# Patient Record
Sex: Female | Born: 1960 | Race: White | Hispanic: No | Marital: Married | Smoking: Never smoker
Health system: Southern US, Community
[De-identification: ages and names within clinical notes are randomized; demographics above are authoritative.]

## PROBLEM LIST (undated history)

## (undated) DIAGNOSIS — L719 Rosacea, unspecified: Secondary | ICD-10-CM

## (undated) DIAGNOSIS — I10 Essential (primary) hypertension: Secondary | ICD-10-CM

## (undated) DIAGNOSIS — E039 Hypothyroidism, unspecified: Secondary | ICD-10-CM

## (undated) DIAGNOSIS — G2581 Restless legs syndrome: Secondary | ICD-10-CM

## (undated) HISTORY — DX: Rosacea, unspecified: L71.9

## (undated) HISTORY — PX: VEIN SURGERY: SHX48

## (undated) HISTORY — PX: BUNIONECTOMY: SHX129

## (undated) HISTORY — DX: Hypothyroidism, unspecified: E03.9

## (undated) HISTORY — PX: OTHER SURGICAL HISTORY: SHX169

---

## 2016-06-05 ENCOUNTER — Encounter: Payer: Self-pay | Admitting: Physician Assistant

## 2016-06-05 ENCOUNTER — Ambulatory Visit (INDEPENDENT_AMBULATORY_CARE_PROVIDER_SITE_OTHER): Payer: BC Managed Care – PPO | Admitting: Physician Assistant

## 2016-06-05 ENCOUNTER — Encounter (INDEPENDENT_AMBULATORY_CARE_PROVIDER_SITE_OTHER): Payer: Self-pay

## 2016-06-05 VITALS — BP 130/90 | HR 77 | Ht 64.0 in | Wt 148.8 lb

## 2016-06-05 DIAGNOSIS — E785 Hyperlipidemia, unspecified: Secondary | ICD-10-CM | POA: Diagnosis not present

## 2016-06-05 DIAGNOSIS — E039 Hypothyroidism, unspecified: Secondary | ICD-10-CM | POA: Diagnosis not present

## 2016-06-05 DIAGNOSIS — R079 Chest pain, unspecified: Secondary | ICD-10-CM | POA: Diagnosis not present

## 2016-06-05 DIAGNOSIS — M5020 Other cervical disc displacement, unspecified cervical region: Secondary | ICD-10-CM | POA: Insufficient documentation

## 2016-06-05 NOTE — Patient Instructions (Addendum)
Medication Instructions:  None Ordered   Labwork: None Ordered   Testing/Procedures: Your physician has requested that you have an exercise tolerance test. For further information please visit [email or web address]. Please also follow instruction sheet, as given.     Follow-Up: Your physician recommends that you schedule a follow-up appointment in: As needed following test.    Any Other Special Instructions Will Be Listed Below (If Applicable).     If you need a refill on your cardiac medications before your next appointment, please call your pharmacy.

## 2016-06-05 NOTE — Progress Notes (Addendum)
Cardiology Office Note    Date:  06/05/2016   ID:  Jessica Haney, DOB [birth year], MRN [id number]  PCP:  Jonathon Bellows, MD  Cardiologist: New  Chief Complaint  Patient presents with  . Chest Pain    History of Present Illness:  Jessica Haney is a 56 y.o. female referred to [place] from Dr. Lujean Amel for chest pain.She has no family history of early CAD, HTN, DM,and never smoked. Had nonfasting lipids checked 05/22/16 Chol 246, HDL 6, trig 131, LDL 143. EKG reviewed from 05/22/16 is normal. She has history of hypothyroidism and rosacea.  She complains of several month history of chest pain described as a tightness that feels like she has to take a deep breath and heart heart is pounding but not going fast or irregular. 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 She walks1 mile a day and also goes to the gym several times a week. She denies any chest pain or tightness while she exercises. She does feel like she gets short of breath a little more quickly than usual but has gained some weight secondary to prednisone for ruptured disc in her neck. She has no radiation of the pain, palpitations, edema, dizziness, or presyncope.   Past Medical History:  Diagnosis Date  . Hypothyroidism   . Rosacea     Past Surgical History:  Procedure Laterality Date  . VEIN SURGERY Right     Current Medications: No outpatient prescriptions prior to visit.   No facility-administered medications prior to visit.      Allergies:   Patient has no known allergies.   Social History   Social History  . Marital status: Married    Spouse name: N/A  . Number of children: N/A  . Years of education: N/A   Social History Main Topics  . Smoking status: Never Smoker  . Smokeless tobacco: Never Used  . Alcohol use None  . Drug use: Unknown  . Sexual activity: Not Asked   Other Topics Concern  . None   Social History Narrative  . None     Family History:  The patient's    family history includes Emphysema in her father; Hyperlipidemia in her father and mother; Hypertension in her father and mother.   ROS:   Please see the history of present illness.    Review of Systems  Constitution: Negative.  HENT: Negative.   Eyes: Negative.   Cardiovascular: Positive for chest pain and dyspnea on exertion.  Respiratory: Negative.   Hematologic/Lymphatic: Negative.   Musculoskeletal: Positive for back pain and neck pain. Negative for joint pain.  Gastrointestinal: Negative.   Genitourinary: Negative.   Neurological: Negative.    All other systems reviewed and are negative.   PHYSICAL EXAM:   VS:  BP 130/90 (BP Location: Right Arm)   Pulse 77   Ht 5\' 4"  (1.626 m)   Wt 148 lb 12.8 oz (67.5 kg)   BMI 25.54 kg/m   Physical Exam  GEN: Well nourished, well developed, in no acute distress  HEENT: normal  Neck: no JVD, carotid bruits, or masses Cardiac:RRR; no murmurs, rubs, or gallops  Respiratory:  clear to auscultation bilaterally, normal work of breathing GI: soft, nontender, nondistended, + BS Ext: without cyanosis, clubbing, or edema, Good distal pulses bilaterally MS: no deformity or atrophy  Skin: warm and dry, no rash Neuro:  Alert and Oriented x 3  Psych: euthymic mood, full affect  Wt Readings  from Last 3 Encounters:  06/05/16 148 lb 12.8 oz (67.5 kg)      Studies/Labs Reviewed:   EKG:  EKG is   ordered today.  The ekg ordered today demonstrates normal sinus rhythm, normal EKG  Recent Labs: No results found for requested labs within last 8760 hours.   Lipid Panel No results found for: CHOL, TRIG, HDL, CHOLHDL, VLDL, LDLCALC, LDLDIRECT  Additional studies/ records that were reviewed today include:  Lipids reviewed from the patient's phone    ASSESSMENT:    1. Chest pain, unspecified type   2. Hyperlipidemia, unspecified hyperlipidemia type   3. Hypothyroidism, unspecified type      PLAN:  In order of problems listed  above:  Chest pain somewhat atypical and suspected stress related. Discussed with Dr. Burt Knack. We recommend GXT. If normal follow-up when necessary  Hyperlipidemia patient's cholesterol and LDL were quite high but they were nonfasting. Recommend diet and repeat nonfasting lipids in 6 months. Can follow-up with primary care for this.  Hypothyroidism treated by primary care.    Medication Adjustments/Labs and Tests Ordered: Current medicines are reviewed at length with the patient today.  Concerns regarding medicines are outlined above.  Medication changes, Labs and Tests ordered today are listed in the Patient Instructions below. Patient Instructions  Medication Instructions:  None Ordered   Labwork: None Ordered   Testing/Procedures: Your physician has requested that you have an exercise tolerance test. For further information please visit [email or web address]. Please also follow instruction sheet, as given.     Follow-Up: Your physician recommends that you schedule a follow-up appointment in: As needed following test.    Any Other Special Instructions Will Be Listed Below (If Applicable).     If you need a refill on your cardiac medications before your next appointment, please call your pharmacy.      Sumner Boast, PA-C  06/05/2016 9:14 AM    [facility] [place], [place],   [address] Phone: [phone]; Fax: [phone]

## 2016-07-06 ENCOUNTER — Encounter (INDEPENDENT_AMBULATORY_CARE_PROVIDER_SITE_OTHER): Payer: Self-pay

## 2016-07-06 ENCOUNTER — Ambulatory Visit (INDEPENDENT_AMBULATORY_CARE_PROVIDER_SITE_OTHER): Payer: BC Managed Care – PPO

## 2016-07-06 DIAGNOSIS — R079 Chest pain, unspecified: Secondary | ICD-10-CM | POA: Diagnosis not present

## 2016-07-06 LAB — EXERCISE TOLERANCE TEST
CHL RATE OF PERCEIVED EXERTION: 16
CSEPED: 9 min
CSEPEDS: 0 s
Estimated workload: 10.1 METS
MPHR: 164 {beats}/min
Peak HR: 148 {beats}/min
Percent HR: 90 %
Rest HR: 67 {beats}/min

## 2016-07-11 ENCOUNTER — Telehealth: Payer: Self-pay | Admitting: Physician Assistant

## 2016-07-11 NOTE — Telephone Encounter (Signed)
New Message     Pt returning your call about exercise test

## 2016-07-11 NOTE — Telephone Encounter (Signed)
-----   Message from Imogene Burn, PA-C sent at 07/10/2016  7:30 AM EDT ----- Normal stress test. F/u with cardiology prn

## 2016-07-11 NOTE — Telephone Encounter (Signed)
Pt returned my call and she has been made aware of her stress test results.

## 2016-07-28 ENCOUNTER — Ambulatory Visit
Admission: RE | Admit: 2016-07-28 | Discharge: 2016-07-28 | Disposition: A | Discharge status: Home / Self Care | Payer: BC Managed Care – PPO | Source: Ambulatory Visit | Attending: Family Medicine | Admitting: Family Medicine

## 2016-07-28 ENCOUNTER — Other Ambulatory Visit: Payer: Self-pay | Admitting: Family Medicine

## 2016-07-28 DIAGNOSIS — M545 Low back pain: Secondary | ICD-10-CM

## 2018-02-05 DIAGNOSIS — N951 Menopausal and female climacteric states: Secondary | ICD-10-CM | POA: Insufficient documentation

## 2018-08-20 ENCOUNTER — Other Ambulatory Visit: Payer: Self-pay | Admitting: Family Medicine

## 2018-08-20 DIAGNOSIS — E2839 Other primary ovarian failure: Secondary | ICD-10-CM

## 2018-08-20 DIAGNOSIS — N6320 Unspecified lump in the left breast, unspecified quadrant: Secondary | ICD-10-CM

## 2018-08-28 ENCOUNTER — Ambulatory Visit
Admission: RE | Admit: 2018-08-28 | Discharge: 2018-08-28 | Disposition: A | Discharge status: Home / Self Care | Payer: BC Managed Care – PPO | Source: Ambulatory Visit | Attending: Family Medicine | Admitting: Family Medicine

## 2018-08-28 ENCOUNTER — Other Ambulatory Visit: Payer: Self-pay

## 2018-08-28 DIAGNOSIS — N6320 Unspecified lump in the left breast, unspecified quadrant: Secondary | ICD-10-CM

## 2018-09-20 ENCOUNTER — Other Ambulatory Visit: Payer: Self-pay | Admitting: Family Medicine

## 2018-09-20 ENCOUNTER — Ambulatory Visit
Admission: RE | Admit: 2018-09-20 | Discharge: 2018-09-20 | Disposition: A | Discharge status: Home / Self Care | Payer: BC Managed Care – PPO | Source: Ambulatory Visit | Attending: Family Medicine | Admitting: Family Medicine

## 2018-09-20 ENCOUNTER — Other Ambulatory Visit: Payer: BC Managed Care – PPO

## 2018-09-20 DIAGNOSIS — R1032 Left lower quadrant pain: Secondary | ICD-10-CM

## 2018-09-20 MED ORDER — IOPAMIDOL (ISOVUE-300) INJECTION 61%
100.0000 mL | Freq: Once | INTRAVENOUS | Status: AC | PRN
  Administered 2018-09-20: 100 mL via INTRAVENOUS

## 2018-10-16 ENCOUNTER — Ambulatory Visit
Admission: RE | Admit: 2018-10-16 | Discharge: 2018-10-16 | Disposition: A | Discharge status: Home / Self Care | Payer: BC Managed Care – PPO | Source: Ambulatory Visit | Attending: Family Medicine | Admitting: Family Medicine

## 2018-10-16 ENCOUNTER — Other Ambulatory Visit: Payer: Self-pay

## 2018-10-16 DIAGNOSIS — E2839 Other primary ovarian failure: Secondary | ICD-10-CM

## 2018-10-30 ENCOUNTER — Other Ambulatory Visit: Payer: BC Managed Care – PPO

## 2019-01-29 ENCOUNTER — Emergency Department (HOSPITAL_COMMUNITY): Payer: BC Managed Care – PPO

## 2019-01-29 ENCOUNTER — Encounter (HOSPITAL_COMMUNITY): Payer: Self-pay

## 2019-01-29 ENCOUNTER — Other Ambulatory Visit: Payer: Self-pay

## 2019-01-29 ENCOUNTER — Emergency Department (HOSPITAL_COMMUNITY)
Admission: EM | Admit: 2019-01-29 | Discharge: 2019-01-30 | Disposition: A | Discharge status: Home / Self Care | Payer: BC Managed Care – PPO | Attending: Emergency Medicine | Admitting: Emergency Medicine

## 2019-01-29 DIAGNOSIS — E039 Hypothyroidism, unspecified: Secondary | ICD-10-CM | POA: Diagnosis not present

## 2019-01-29 DIAGNOSIS — R0789 Other chest pain: Secondary | ICD-10-CM | POA: Diagnosis present

## 2019-01-29 DIAGNOSIS — R079 Chest pain, unspecified: Secondary | ICD-10-CM | POA: Diagnosis not present

## 2019-01-29 LAB — CBC
HCT: 42.6 % (ref 36.0–46.0)
Hemoglobin: 13.6 g/dL (ref 12.0–15.0)
MCH: 30.6 pg (ref 26.0–34.0)
MCHC: 31.9 g/dL (ref 30.0–36.0)
MCV: 95.9 fL (ref 80.0–100.0)
Platelets: 226 10*3/uL (ref 150–400)
RBC: 4.44 MIL/uL (ref 3.87–5.11)
RDW: 12.3 % (ref 11.5–15.5)
WBC: 7.2 10*3/uL (ref 4.0–10.5)
nRBC: 0 % (ref 0.0–0.2)

## 2019-01-29 LAB — BASIC METABOLIC PANEL
Anion gap: 10 (ref 5–15)
BUN: 13 mg/dL (ref 6–20)
CO2: 26 mmol/L (ref 22–32)
Calcium: 9.3 mg/dL (ref 8.9–10.3)
Chloride: 106 mmol/L (ref 98–111)
Creatinine, Ser: 0.74 mg/dL (ref 0.44–1.00)
GFR calc Af Amer: >60 mL/min (ref >60)
GFR calc non Af Amer: >60 mL/min (ref >60)
Glucose, Bld: 80 mg/dL (ref 70–99)
Potassium: 3.8 mmol/L (ref 3.5–5.1)
Sodium: 142 mmol/L (ref 135–145)

## 2019-01-29 LAB — TROPONIN I (HIGH SENSITIVITY)
Troponin I (High Sensitivity): <2 ng/L (ref <18)
Troponin I (High Sensitivity): <2 ng/L (ref <18)

## 2019-01-29 NOTE — ED Triage Notes (Signed)
Pt c.o chest pressure for the past couple of days, no other symptoms. Pt a.o, resp e.u

## 2019-01-30 DIAGNOSIS — R079 Chest pain, unspecified: Secondary | ICD-10-CM | POA: Diagnosis not present

## 2019-01-30 LAB — D-DIMER, QUANTITATIVE: D-Dimer, Quant: <0.27 ug/mL-FEU (ref 0.00–0.50)

## 2019-01-30 MED ORDER — IBUPROFEN 800 MG PO TABS: 800.0000 mg | ORAL_TABLET | Freq: Three times a day (TID) | ORAL | 0 refills | Status: DC

## 2019-01-30 NOTE — ED Provider Notes (Signed)
[facility] EMERGENCY DEPARTMENT Provider Note   CSN: [id number] Arrival date & time: 01/29/19  1605     History Chief Complaint  Patient presents with  . Chest Pain    Jessica Haney is a 58 y.o. female.  Patient presents to the emergency department with a chief complaint of central chest pain.  She states that she has had the pain for the past 3 to 4 days.  She describes it as a pressure.  Nothing makes the symptoms better or worse.  It is not reproducible.  She denies any associated shortness of breath, but states that sometimes when the pain is present it is hard to take a deep breath.  She denies any fever, chills, cough.  She states that she has recently traveled to [place] by car.  She has history of varicose veins.  She denies any history of PE or DVT.  She denies any family history of early cardiac disease.  She does not smoke.  She has not taken anything for her symptoms.  She does not recall any injuries.  The history is provided by the patient. No language interpreter was used.       Past Medical History:  Diagnosis Date  . Hypothyroidism   . Rosacea     Patient Active Problem List   Diagnosis Date Noted  . Chest pain 06/05/2016  . Hyperlipidemia 06/05/2016  . Hypothyroidism 06/05/2016  . Ruptured disc, cervical 06/05/2016    Past Surgical History:  Procedure Laterality Date  . VEIN SURGERY Right      OB History   No obstetric history on file.     Family History  Problem Relation Age of Onset  . Hyperlipidemia Mother   . Hypertension Mother   . Hypertension Father   . Hyperlipidemia Father   . Emphysema Father   . Breast cancer Sister 22    Social History   Tobacco Use  . Smoking status: Never Smoker  . Smokeless tobacco: Never Used  Substance Use Topics  . Alcohol use: Not on file  . Drug use: Not on file    Home Medications Prior to Admission medications   Medication Sig Start Date End Date Taking? Authorizing  Provider  IBUPROFEN PO Take as directed as needed for back pain.    [provider]  levothyroxine (SYNTHROID, LEVOTHROID) 25 MCG tablet Take 25 mcg by mouth daily before breakfast.    [provider]    Allergies    Patient has no known allergies.  Review of Systems   Review of Systems  All other systems reviewed and are negative.   Physical Exam Updated Vital Signs BP (!) 151/96 (BP Location: Left Arm)   Pulse 65   Temp 97.8 F (36.6 C) (Oral)   Resp 18   Ht 5\' 4"  (1.626 m)   Wt 70.3 kg   SpO2 100%   BMI 26.61 kg/m   Physical Exam Vitals and nursing note reviewed.  Constitutional:      General: She is not in acute distress.    Appearance: She is well-developed.  HENT:     Head: Normocephalic and atraumatic.  Eyes:     Conjunctiva/sclera: Conjunctivae normal.  Cardiovascular:     Rate and Rhythm: Normal rate and regular rhythm.     Heart sounds: No murmur.  Pulmonary:     Effort: Pulmonary effort is normal. No respiratory distress.     Breath sounds: Normal breath sounds.  Abdominal:  Palpations: Abdomen is soft.     Tenderness: There is no abdominal tenderness.  Musculoskeletal:        General: Normal range of motion.     Cervical back: Neck supple.     Right lower leg: No edema.     Left lower leg: No edema.  Skin:    General: Skin is warm and dry.  Neurological:     Mental Status: She is alert and oriented to person, place, and time.  Psychiatric:        Mood and Affect: Mood normal.        Behavior: Behavior normal.     ED Results / Procedures / Treatments   Labs (all labs ordered are listed, but only abnormal results are displayed) Labs Reviewed  BASIC METABOLIC PANEL  CBC  D-DIMER, QUANTITATIVE (NOT AT [facility])  TROPONIN I (HIGH SENSITIVITY)  TROPONIN I (HIGH SENSITIVITY)    EKG EKG Interpretation  Date/Time:  Wednesday January 29 2019 16:20:00 EST Ventricular Rate:  84 PR Interval:  152 QRS Duration: 76 QT  Interval:  356 QTC Calculation: 420 R Axis:   46 Text Interpretation: Normal sinus rhythm Low voltage QRS Borderline ECG No acute changes q waves in v1 and v2 Nonspecific ST abnormality in v1 and v2 No old tracing to compare Confirmed by Varney Biles [phone]) on 01/30/2019 5:13:23 AM   Radiology DG Chest 2 View  Result Date: 01/29/2019 CLINICAL DATA:  Left chest pain EXAM: CHEST - 2 VIEW COMPARISON:  None. FINDINGS: The heart size and mediastinal contours are within normal limits. Both lungs are clear. No pleural effusion or pneumothorax. The visualized skeletal structures are unremarkable. IMPRESSION: No acute process in the chest. Electronically Signed   By: Macy Mis M.D.   On: 01/29/2019 17:06    Procedures Procedures (including critical care time)  Medications Ordered in ED Medications - No data to display  ED Course  I have reviewed the triage vital signs and the nursing notes.  Pertinent labs & imaging results that were available during my care of the patient were reviewed by me and considered in my medical decision making (see chart for details).    MDM Rules/Calculators/A&P                      Patient with central chest pain x3 to 4 days.  There are no reproducible symptoms.  She denies any injuries.  She has had recent long travel.  She also has history of varicose veins.  She does not have any lower extremity swelling or pain.  PE remains on the differential.  Will check D-dimer.  As long as D-dimer is negative, including age-adjusted negative, I believe the patient can be safely discharged.  I doubt ACS.  She has had repeat troponins which are negative.  Her heart score is 3.  Given the length of time that she has had symptoms, would have expected her troponin to be elevated in the case of ACS.  Her chest x-ray is negative.  Her symptoms are not positional, I doubt pericarditis.  Costochondritis is also considered.  Patient signed out to oncoming PA, Albrizze, who  will continue care.  Plan: F/u on d-dimer and repeat EKG  Final Clinical Impression(s) / ED Diagnoses Final diagnoses:  Nonspecific chest pain    Rx / DC Orders ED Discharge Orders    None       Montine Circle, PA-C 01/30/19 [id number]    Varney Biles, MD 02/08/19  2220  

## 2019-01-30 NOTE — ED Notes (Signed)
Patient verbalizes understanding of discharge instructions. Opportunity for questioning and answers were provided. Armband removed by staff, pt discharged from ED. Ambulated out to lobby  

## 2019-01-30 NOTE — ED Provider Notes (Addendum)
Care assumed from R. Browning  PA-C at shift change pending .  See his note for full H&P.   Briefly this is 58 yo female presenting with centralized chest pressure x 4 days. She had recent travel with trip to [place], denies history of PE, DVT, early cardiac disease.  Plan is to follow up on d-dimer and dispo accordingly.   Physical Exam  BP 121/80   Pulse 65   Temp 97.8 F (36.6 C) (Oral)   Resp 13   Ht 5\' 4"  (1.626 m)   Wt 70.3 kg   SpO2 98%   BMI 26.61 kg/m   Physical Exam  PE: Constitutional: well-developed, well-nourished, no apparent distress HENT: normocephalic, atraumatic. no cervical adenopathy Cardiovascular: normal rate and rhythm, distal pulses intact Pulmonary/Chest: effort normal; breath sounds clear and equal bilaterally; no wheezes or rales Abdominal: soft and nontender Musculoskeletal: full ROM, no edema. Homans sign absent bilaterally, no lower extremity edema, no palpable cords, compartments are soft Neurological: alert with goal directed thinking Skin: warm and dry, no rash, no diaphoresis Psychiatric: normal mood and affect, normal behavior    ED Course/Procedures   Results for orders placed or performed during the hospital encounter of 01/29/19 (from the past 24 hour(s))  Troponin I (High Sensitivity)     Status: None   Collection Time: 01/29/19  7:50 PM  Result Value Ref Range   Troponin I (High Sensitivity) <2 <18 ng/L  D-dimer, quantitative (not at [facility])     Status: None   Collection Time: 01/30/19  6:46 AM  Result Value Ref Range   D-Dimer, Quant <0.27 0.00 - 0.50 ug/mL-FEU    EKG Interpretation  Date/Time:  Wednesday January 29 2019 16:20:00 EST Ventricular Rate:  84 PR Interval:  152 QRS Duration: 76 QT Interval:  356 QTC Calculation: 420 R Axis:   46 Text Interpretation: Normal sinus rhythm Low voltage QRS Borderline ECG No acute changes q waves in v1 and v2 Nonspecific ST abnormality in v1 and v2 No old tracing to compare Confirmed by  Varney Biles [phone]) on 01/30/2019 5:13:23 AM       EKG Interpretation  Date/Time:  Thursday January 30 2019 06:39:27 EST Ventricular Rate:  59 PR Interval:  152 QRS Duration: 91 QT Interval:  449 QTC Calculation: 445 R Axis:   10 Text Interpretation: Sinus rhythm Low voltage, precordial leads Anteroseptal infarct, old No STEMI Confirmed by Octaviano Glow [id number]) on 01/30/2019 7:17:35 AM       CHEST - 2 VIEW    COMPARISON: None.    FINDINGS:  The heart size and mediastinal contours are within normal limits.  Both lungs are clear. No pleural effusion or pneumothorax. The  visualized skeletal structures are unremarkable.    IMPRESSION:  No acute process in the chest.      Electronically Signed  By: Macy Mis M.D.  On: 01/29/2019 17:06     MDM   Patient received in signout.  Please see previous provider note. She is 58 year old female presenting with centralized chest pain x4 days.  Work-up for previous provider is overall negative including CBC, BMP, delta troponin. I viewed pt's chest xray and it does not suggest acute infectious processes. Previous provider felt she was low risk for ACS. Heart score of 3. First EKG is poor quality and when repeated does not show stemi.  D-dimer is negative.  Updated patient on results and will discharge with symptomatic care. Recommend close pcp follow up in 2-5 days for recheck, sooner if  symptoms persist. The patient appears reasonably screened and/or stabilized for discharge and I doubt any other medical condition or other [facility] requiring further screening, evaluation, or treatment in the ED at this time prior to discharge. The patient is safe for discharge with strict return precautions discussed.   Portions of this note were generated with Lobbyist. Dictation errors may occur despite best attempts at proofreading.  DX: nonspecific chest pain      Cherre Robins, PA-C 01/30/19 0920     Cherre Robins, PA-C 01/30/19 Warnell Bureau, MD 02/01/19 [phone]

## 2019-04-05 ENCOUNTER — Ambulatory Visit: Payer: BC Managed Care – PPO | Attending: Internal Medicine

## 2019-04-05 DIAGNOSIS — Z23 Encounter for immunization: Secondary | ICD-10-CM | POA: Insufficient documentation

## 2019-04-05 NOTE — Progress Notes (Signed)
   Covid-19 Vaccination Clinic  Name:  Jessica Haney    MRN: [id number] DOB: [birth year]  04/05/2019  Jessica Haney was observed post Covid-19 immunization for 15 minutes without incidence. She was provided with Vaccine Information Sheet and instruction to access the V-Safe system.   Jessica Haney was instructed to call 911 with any severe reactions post vaccine: Marland Kitchen Difficulty breathing  . Swelling of your face and throat  . A fast heartbeat  . A bad rash all over your body  . Dizziness and weakness    Immunizations Administered    Name Date Dose VIS Date Route   Pfizer COVID-19 Vaccine 04/05/2019 10:58 AM 0.3 mL 01/17/2019 Intramuscular   Manufacturer: [place]   Lot: [id number]   [address]: [id number]

## 2019-04-26 ENCOUNTER — Ambulatory Visit: Payer: BC Managed Care – PPO | Attending: Internal Medicine

## 2019-04-26 DIAGNOSIS — Z23 Encounter for immunization: Secondary | ICD-10-CM

## 2019-04-26 NOTE — Progress Notes (Signed)
   Covid-19 Vaccination Clinic  Name:  Jessica Haney    MRN: [id number] DOB: [birth year]  04/26/2019  [name] was observed post Covid-19 immunization for 15 minutes without incident. She was provided with Vaccine Information Sheet and instruction to access the V-Safe system.   [name] was instructed to call 911 with any severe reactions post vaccine: Marland Kitchen Difficulty breathing  . Swelling of face and throat  . A fast heartbeat  . A bad rash all over body  . Dizziness and weakness   Immunizations Administered    Name Date Dose VIS Date Route   Pfizer COVID-19 Vaccine 04/26/2019 11:28 AM 0.3 mL 01/17/2019 Intramuscular   Manufacturer: [place]   Lot: [id number]   [place]: [id number]

## 2019-04-30 ENCOUNTER — Ambulatory Visit: Payer: BC Managed Care – PPO

## 2019-08-13 ENCOUNTER — Other Ambulatory Visit: Payer: Self-pay

## 2019-08-13 ENCOUNTER — Ambulatory Visit (INDEPENDENT_AMBULATORY_CARE_PROVIDER_SITE_OTHER): Payer: BC Managed Care – PPO

## 2019-08-13 ENCOUNTER — Encounter: Payer: Self-pay | Admitting: Podiatry

## 2019-08-13 ENCOUNTER — Ambulatory Visit (INDEPENDENT_AMBULATORY_CARE_PROVIDER_SITE_OTHER): Payer: BC Managed Care – PPO | Admitting: Podiatry

## 2019-08-13 DIAGNOSIS — M79671 Pain in right foot: Secondary | ICD-10-CM | POA: Diagnosis not present

## 2019-08-13 DIAGNOSIS — M84374A Stress fracture, right foot, initial encounter for fracture: Secondary | ICD-10-CM

## 2019-08-13 NOTE — Progress Notes (Signed)
gf

## 2019-08-15 MED ORDER — METHYLPREDNISOLONE 4 MG PO TBPK: ORAL_TABLET | ORAL | 0 refills | Status: DC

## 2019-08-15 MED ORDER — MELOXICAM 15 MG PO TABS: 15.0000 mg | ORAL_TABLET | Freq: Every day | ORAL | 1 refills | Status: DC

## 2019-08-15 NOTE — Progress Notes (Signed)
   HPI: 59 y.o. female presenting today as a new patient for evaluation of pain that is been going on to the right forefoot for the past several months.  Patient does have a history of bilateral foot surgery, bunionectomy procedures.  Right foot surgery: 10 years prior.  Left foot surgery: 20 years prior.  Patient states that over the past few months she has had increased pain and tenderness to the right forefoot.  She is very active she is having pain on the top of the foot near the fifth toe area.  Sudden onset and she denies any history of injury.  Past Medical History:  Diagnosis Date  . Hypothyroidism   . Rosacea      Physical Exam: General: The patient is alert and oriented x3 in no acute distress.  Dermatology: Skin is warm, dry and supple bilateral lower extremities. Negative for open lesions or macerations.  Vascular: Palpable pedal pulses bilaterally. No edema or erythema noted. Capillary refill within normal limits.  Neurological: Epicritic and protective threshold grossly intact bilaterally.   Musculoskeletal Exam: Range of motion within normal limits to all pedal and ankle joints bilateral. Muscle strength 5/5 in all groups bilateral.  There is pain on palpation just proximal to the fourth MTPJ along the fourth metatarsal neck consistent with a possible stress fracture/overuse injury.  Radiographic Exam:  Normal osseous mineralization. Joint spaces preserved. No fracture/dislocation/boney destruction.  There is not appear to be any indication of cortical irregularity or stress fracture along the fourth metatarsal or the surrounding areas  Assessment: 1.  Suspect stress fracture fourth metatarsal right foot based on clinical exam  Plan of Care:  1. Patient evaluated. X-Rays reviewed.  2.  Prescription for Medrol Dosepak 3.  Prescription for meloxicam 15 mg daily 4.  Recommend good supportive shoe gear/sneakers 5.  Recommend reducing the patient's activity and resting the  foot 6.  Return to clinic as needed  *Going to [place], [place] for 3 weeks.  Her daughter is having a baby there      Edrick Kins, DPM [facility]  Dr. Edrick Kins, DPM    [address], [place] [address]                Office [phone]  Fax [phone]

## 2019-11-10 IMAGING — MG DIGITAL DIAGNOSTIC UNILATERAL LEFT MAMMOGRAM WITH TOMO AND CAD
6 series · 6 of 18 positions shown · non-contrast
Comparison: Previous exam(s).

CLINICAL DATA: 58-year-old female presenting for evaluation of a
palpable lump in the left breast. The patient has family history of
breast cancer in her sister diagnosed at age 40.

EXAM:
DIGITAL DIAGNOSTIC LEFT MAMMOGRAM WITH CAD AND TOMO
ULTRASOUND LEFT BREAST

[L TAN synth-2D]
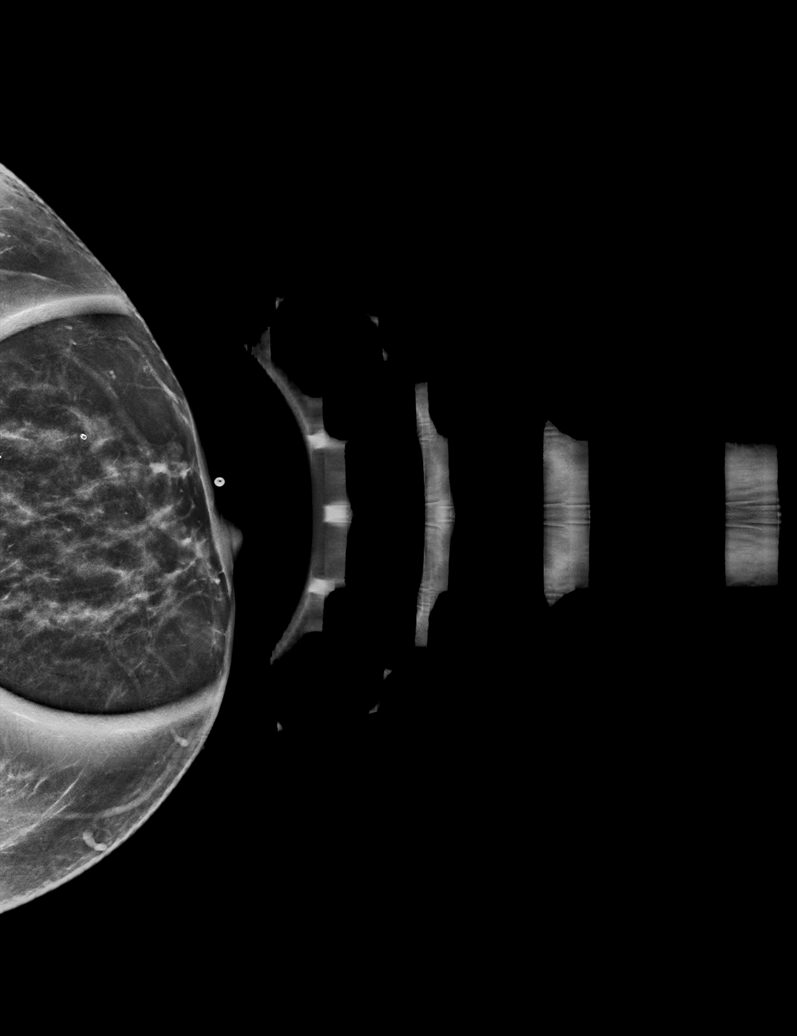

[L CC synth-2D]
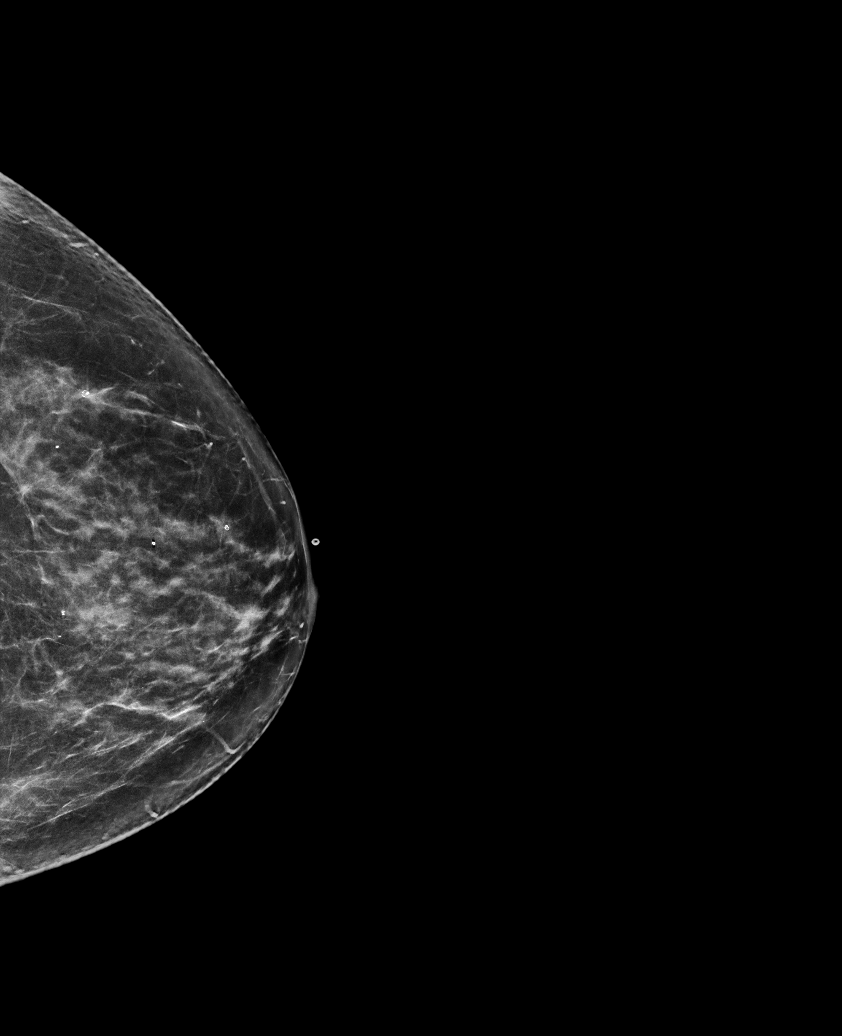

[L MLO synth-2D]
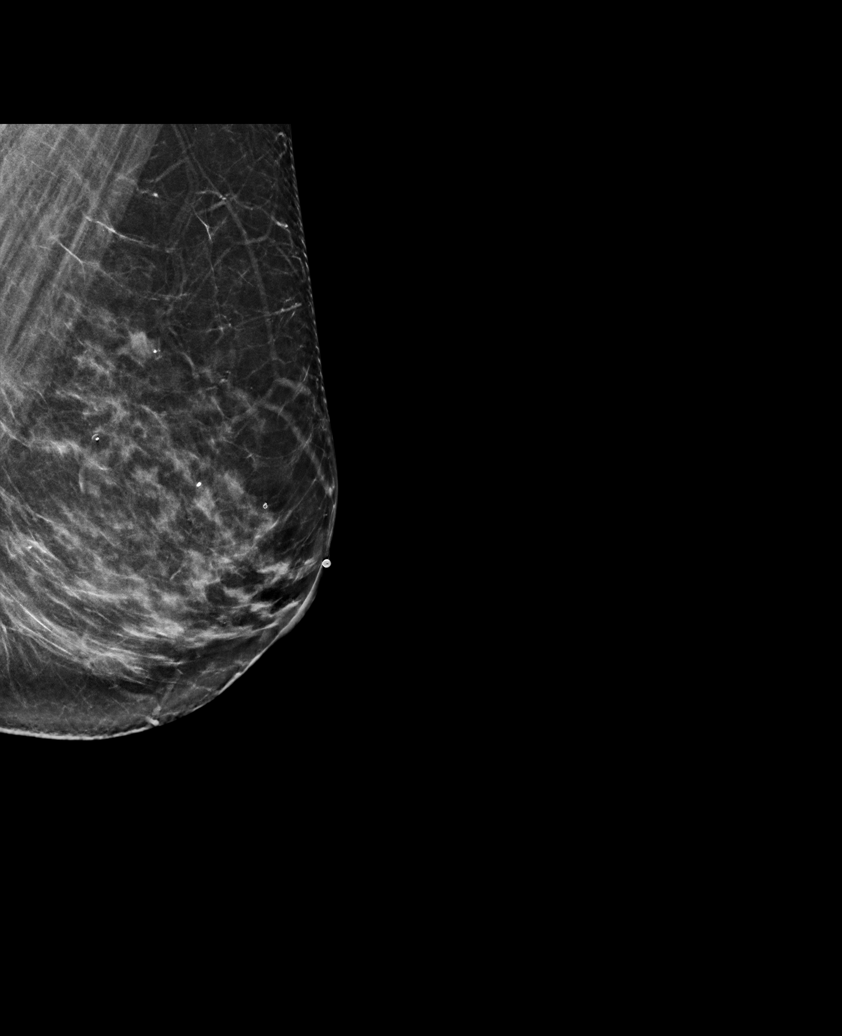

[L MLO tomo · tomo slice 37/72.0]
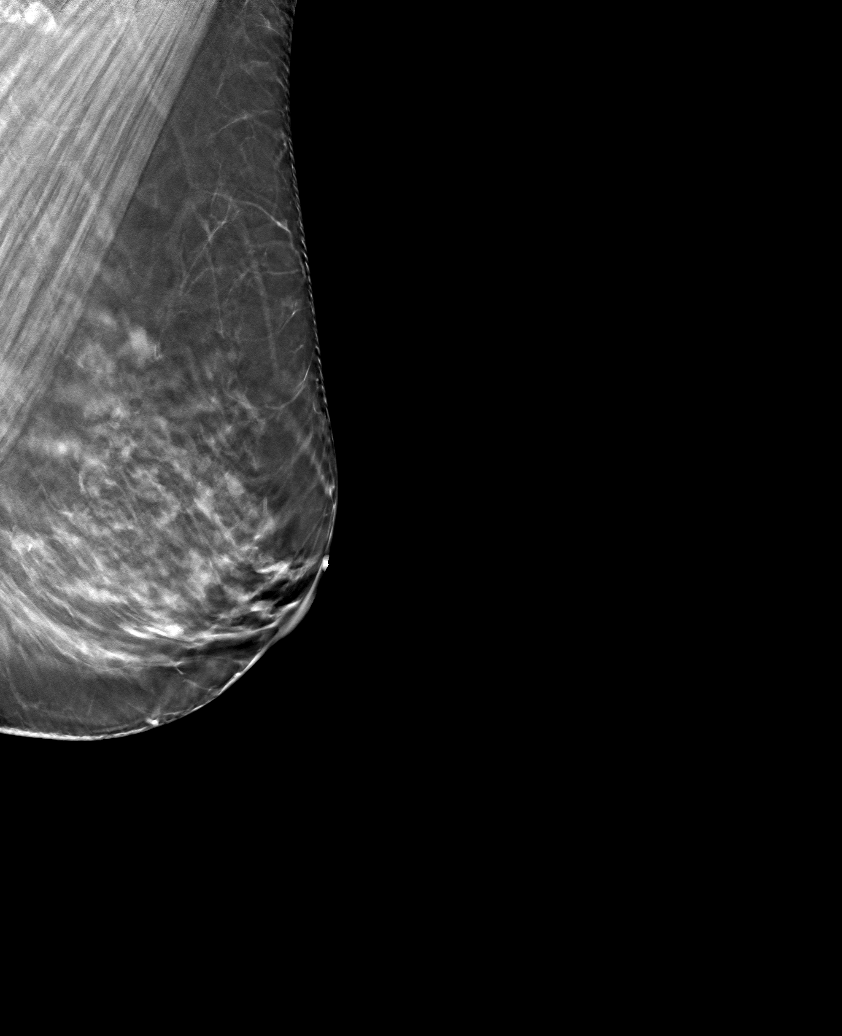

[L CC tomo · tomo slice 36/71.0]
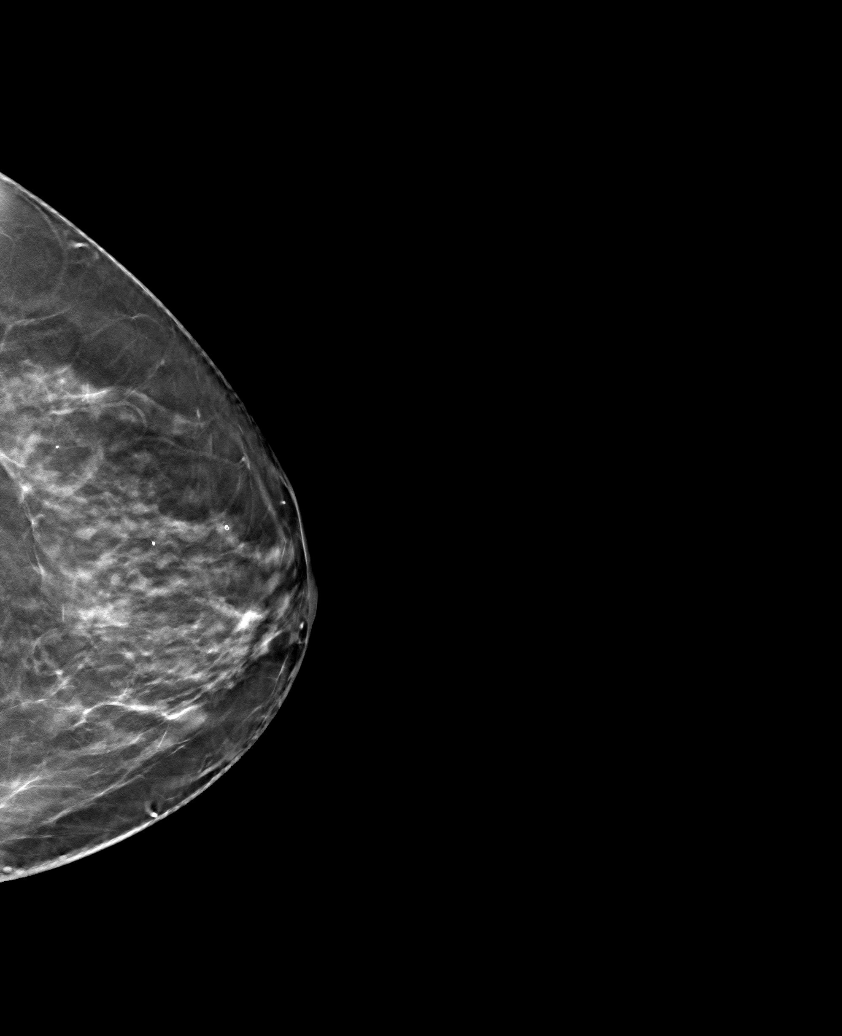

[L TAN tomo · tomo slice 27/52.0]
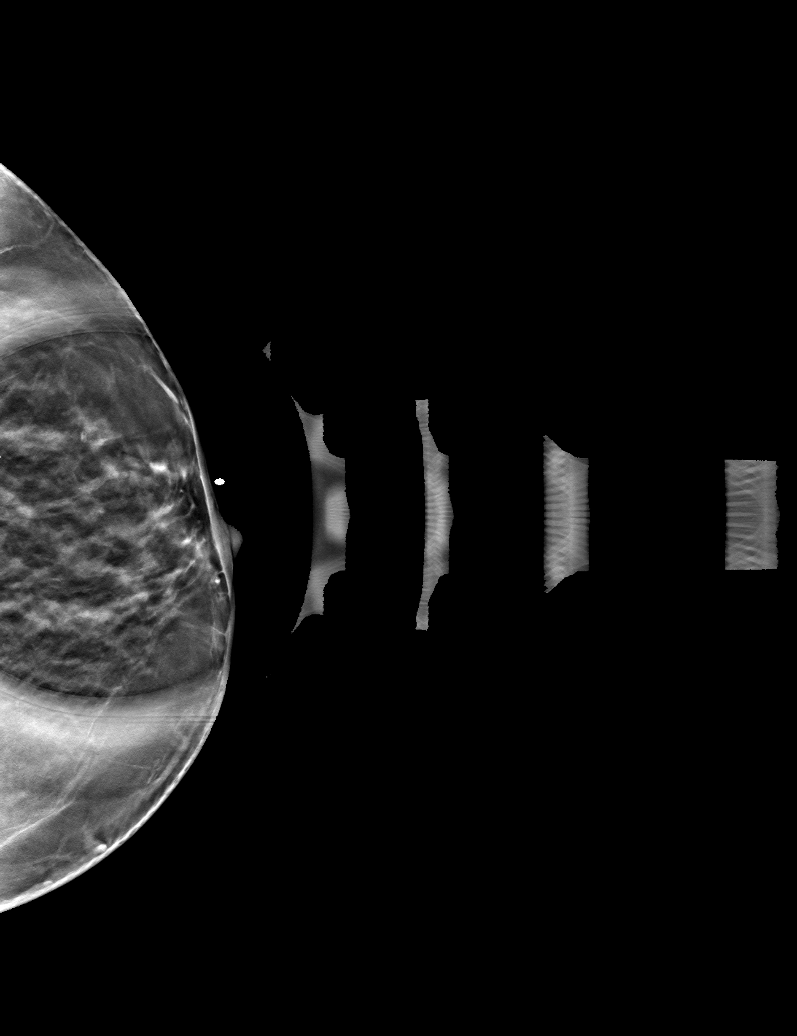

[6 of 18 positions shown; findings below may reference images not displayed]

ACR Breast Density Category c: The breast tissue is heterogeneously
dense, which may obscure small masses.
FINDINGS: A BB has been placed on the palpable site of concern just lateral to
the left nipple. There are no suspicious findings deep to the
palpable marker.

Mammographic images were processed with CAD.

On physical exam, there is a firm ridge of tissue at the palpable
site lateral to the left nipple. No suspicious palpable masses are
identified.

Targeted ultrasound is performed, showing normal fibroglandular
tissue at the palpable site lateral to the left nipple. No
suspicious masses or areas of shadowing are identified.
IMPRESSION: There are no suspicious mammographic or targeted sonographic
abnormalities at the palpable site of concern in the left breast.

RECOMMENDATION:
1. Clinical follow-up recommended for the palpable area of concern
in the left breast. Any further workup should be based on clinical
grounds.

2. Return to routine screening mammography is recommended. The
patient will be due for screening in Monday January, 2019.

I have discussed the findings and recommendations with the patient.
Results were also provided in writing at the conclusion of the
visit. If applicable, a reminder letter will be sent to the patient
regarding the next appointment.

BI-RADS CATEGORY  1: Negative.

## 2019-11-10 IMAGING — US ULTRASOUND LEFT BREAST LIMITED
1 series · 2 of 2 positions shown · non-contrast
Comparison: Previous exam(s).

CLINICAL DATA: 58-year-old female presenting for evaluation of a
palpable lump in the left breast. The patient has family history of
breast cancer in her sister diagnosed at age 40.

EXAM:
DIGITAL DIAGNOSTIC LEFT MAMMOGRAM WITH CAD AND TOMO
ULTRASOUND LEFT BREAST

[Series 1: ultrasound left breast limited · 0.06mm/px · 2 of 2 slices shown]
[im 1/2]
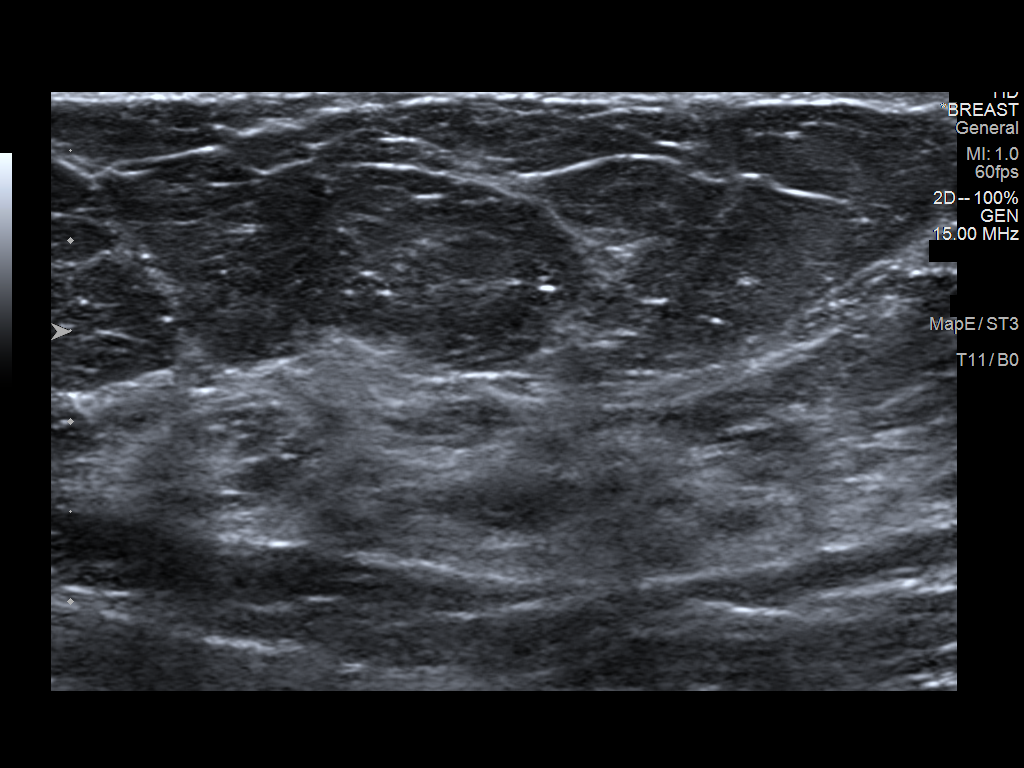
[im 2/2]
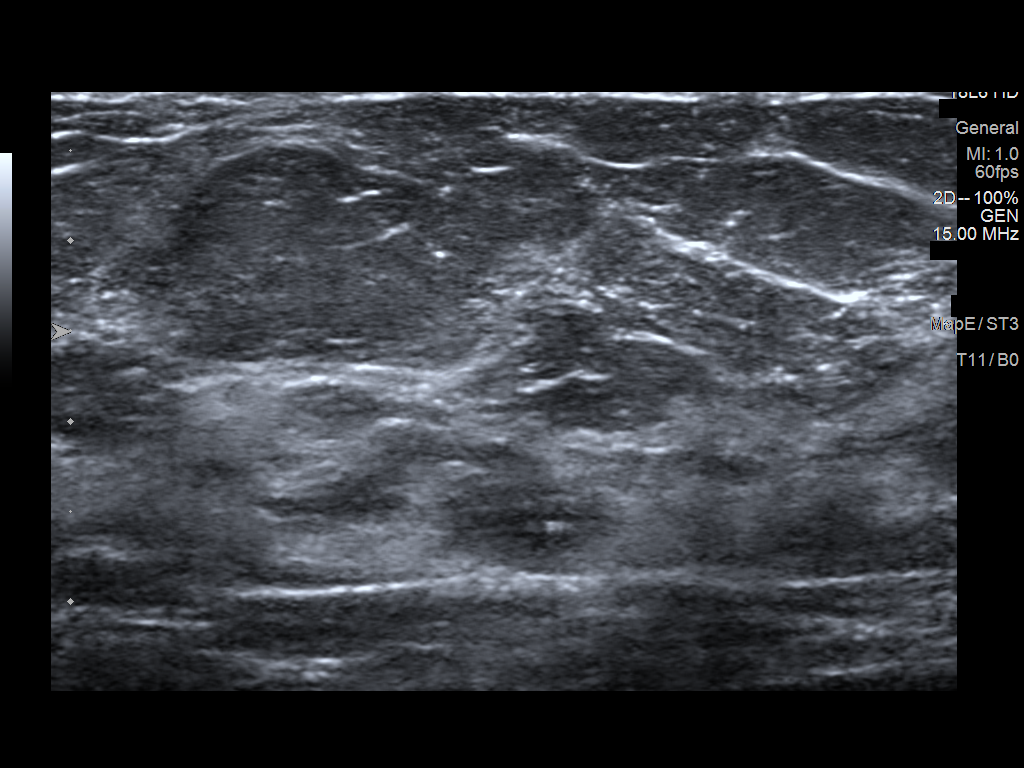

[2 of 2 positions shown; findings below may reference images not displayed]

ACR Breast Density Category c: The breast tissue is heterogeneously
dense, which may obscure small masses.
FINDINGS: A BB has been placed on the palpable site of concern just lateral to
the left nipple. There are no suspicious findings deep to the
palpable marker.

Mammographic images were processed with CAD.

On physical exam, there is a firm ridge of tissue at the palpable
site lateral to the left nipple. No suspicious palpable masses are
identified.

Targeted ultrasound is performed, showing normal fibroglandular
tissue at the palpable site lateral to the left nipple. No
suspicious masses or areas of shadowing are identified.
IMPRESSION: There are no suspicious mammographic or targeted sonographic
abnormalities at the palpable site of concern in the left breast.

RECOMMENDATION:
1. Clinical follow-up recommended for the palpable area of concern
in the left breast. Any further workup should be based on clinical
grounds.

2. Return to routine screening mammography is recommended. The
patient will be due for screening in Monday January, 2019.

I have discussed the findings and recommendations with the patient.
Results were also provided in writing at the conclusion of the
visit. If applicable, a reminder letter will be sent to the patient
regarding the next appointment.

BI-RADS CATEGORY  1: Negative.

## 2019-12-03 IMAGING — CT CT ABDOMEN AND PELVIS WITH CONTRAST
2 of 5 series · 15 of 46 positions shown, 17 images · IV contrast (iopamidol)
Comparison: None.

CLINICAL DATA: Lower abdominal pain, primarily left-sided, with
nausea and vomiting

EXAM:
CT ABDOMEN AND PELVIS WITH CONTRAST
TECHNIQUE: Multidetector CT imaging of the abdomen and pelvis was performed
using the standard protocol following bolus administration of
intravenous contrast.
CONTRAST:  100mL 3FBETH-FKK IOPAMIDOL (3FBETH-FKK) INJECTION 61%

[Series 2: abd pelvis 5.00 br40 s3 axial · axial · 0.73mm/px · z∈[+1090,+1505]mm · 12 of 93 slices shown, 14 images]
[im 5/93  soft-tissue]
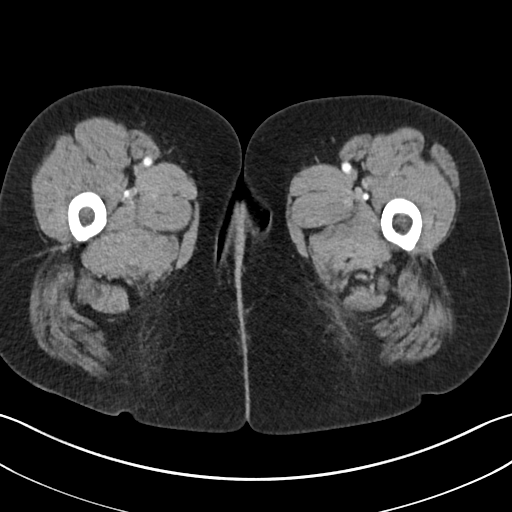
[im 5/93  bone]
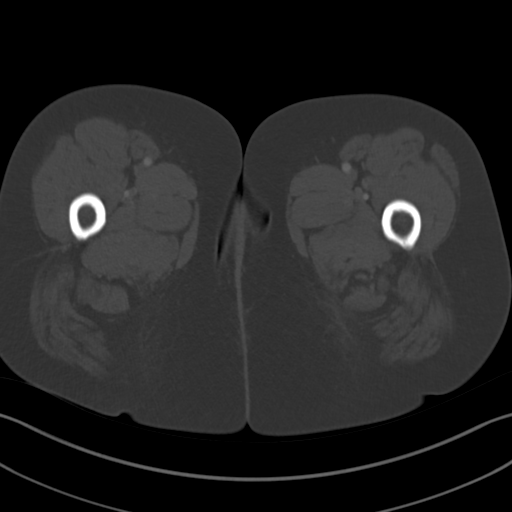
[im 15/93  soft-tissue]
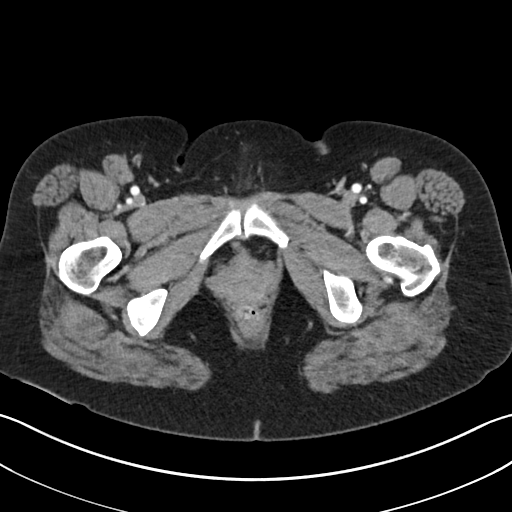
[im 20/93  soft-tissue]
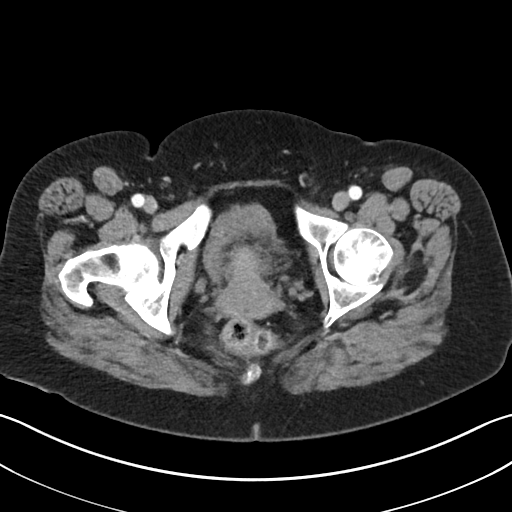
[im 30/93  soft-tissue]
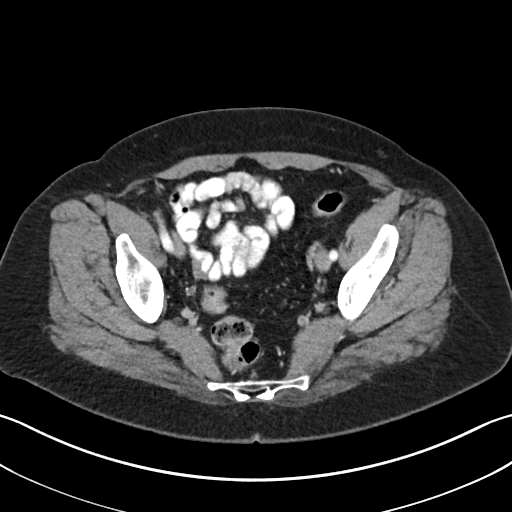
[im 34/93  soft-tissue]
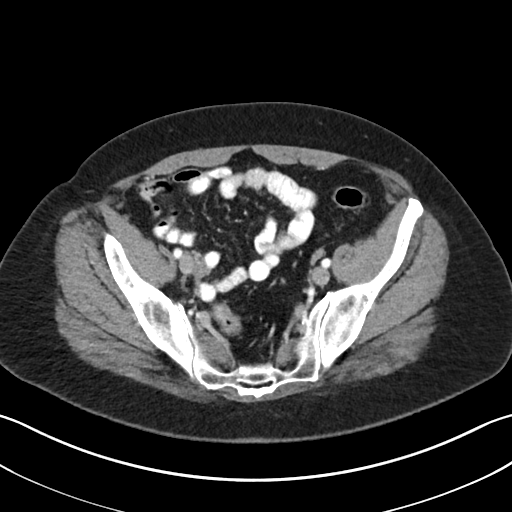
[im 44/93  soft-tissue]
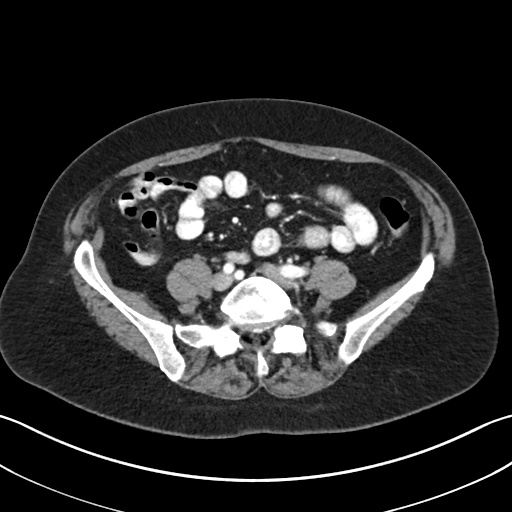
[im 49/93  soft-tissue]
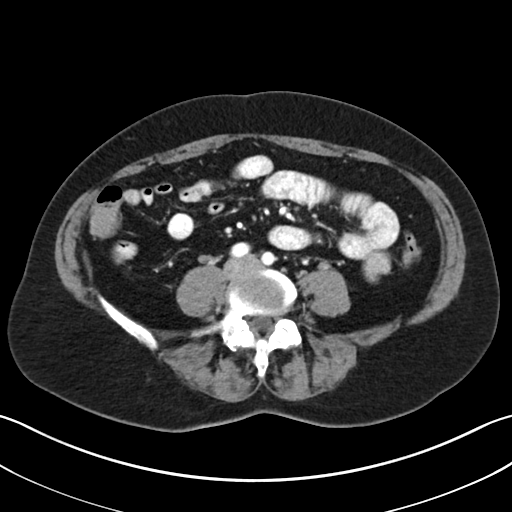
[im 59/93  soft-tissue]
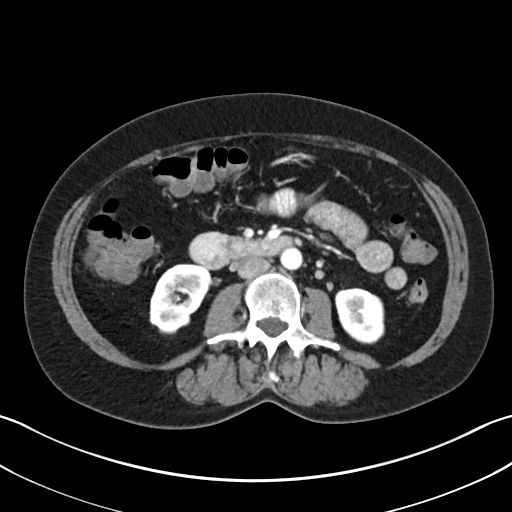
[im 63/93  soft-tissue]
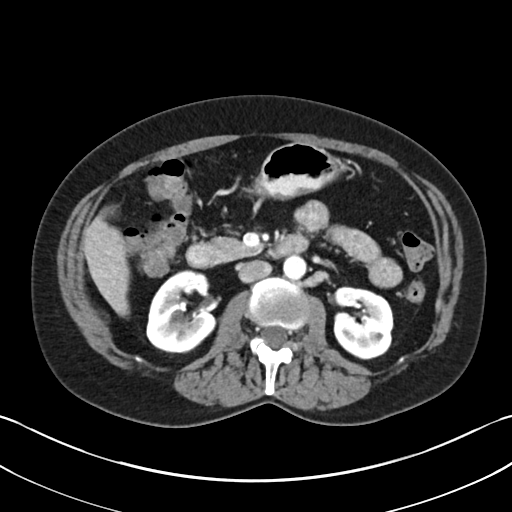
[im 63/93  bone]
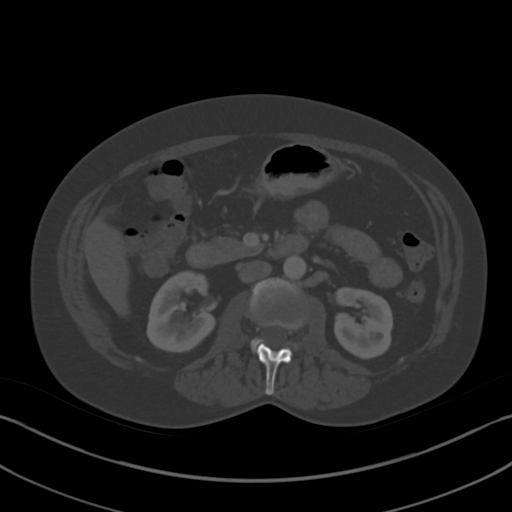
[im 73/93  soft-tissue]
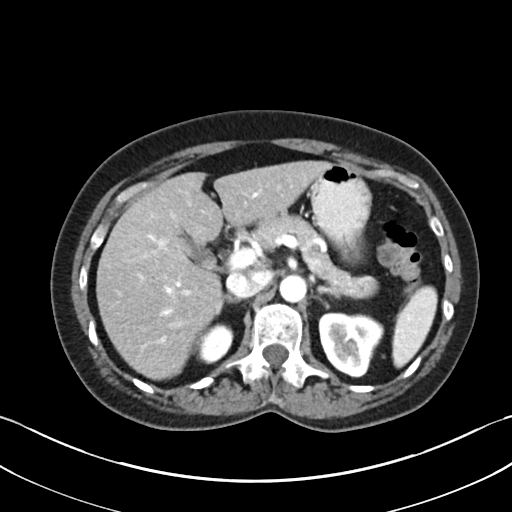
[im 78/93  soft-tissue]
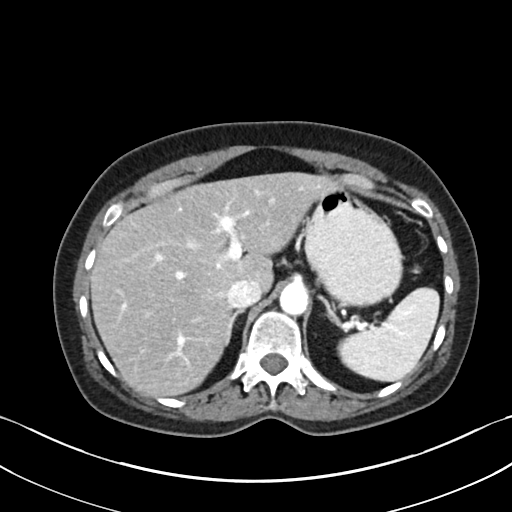
[im 88/93  soft-tissue]
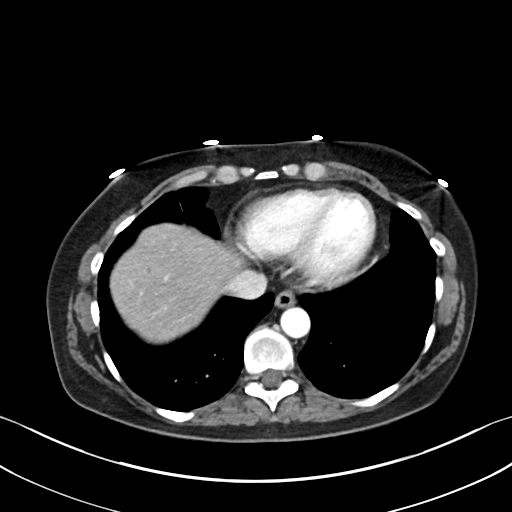

[Series 6: abd pelvis 2.00 br40 s3 cor · coronal · 0.73mm/px · 3 of 137 slices shown]
[im 46/137  soft-tissue]
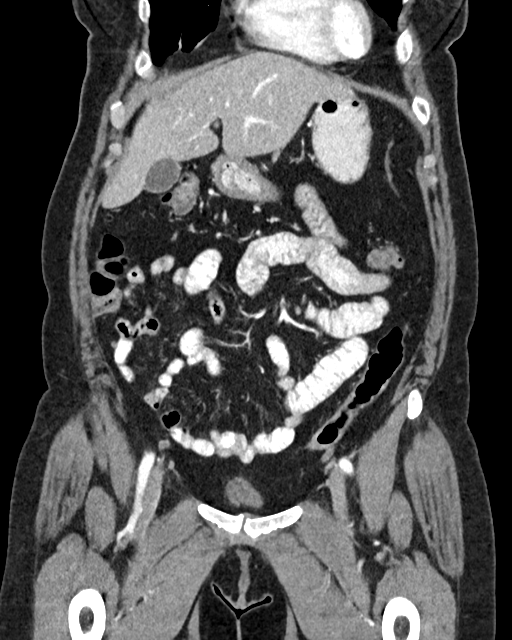
[im 61/137  soft-tissue]
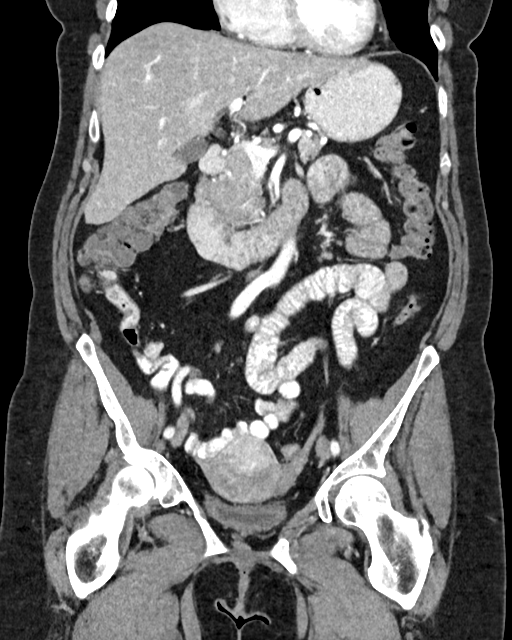
[im 76/137  soft-tissue]
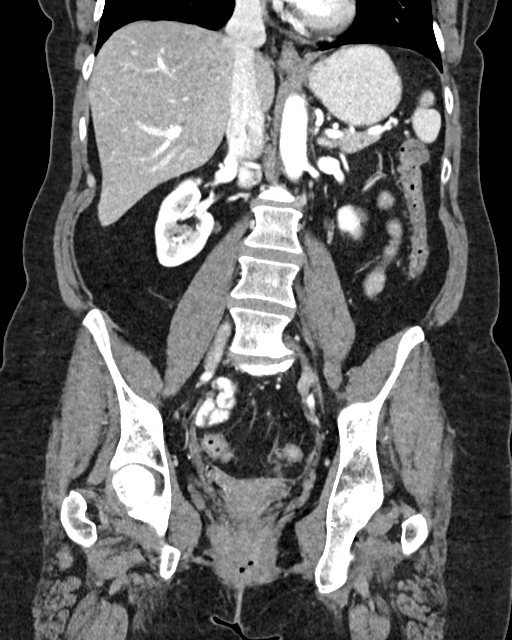

[15 of 46 positions shown; findings below may reference images not displayed]

FINDINGS: Lower chest: There is mild atelectasis in the right middle lobe.
There is no lung base edema or consolidation.

Hepatobiliary: There is a 4 mm probable cyst in the posterior
segment of the right lobe of the liver period no other focal liver
lesions are evident. There is a degree of hepatic steatosis.
Gallbladder wall is not appreciably thickened. There is no biliary
duct dilatation.

Pancreas: No pancreatic mass or inflammatory focus evident.

Spleen: No splenic lesions evident.

Adrenals/Urinary Tract: Adrenals bilaterally appear normal. There is
a 7 x 7 mm probable cyst in the posterior mid right kidney. More
inferiorly, there is an 8 x 8 mm cyst in the right kidney. There is
minimal hydronephrosis on the right. There is no hydronephrosis
appreciable on the left. On the right, there is a calculus in the
lateral midportion of the kidney with an area of nearby mild
scarring measuring 7 x 6 mm. There is a 3 x 3 mm calculus more
inferiorly in the right kidney. On the left, there is a calculus in
the lower pole region measuring 8 x 7 mm. There is a nearby 2 mm
calculus in the lower pole left kidney region. There is a 2 mm
calculus in the mid left kidney region as well as a 1 mm calculus
slightly more inferiorly on the left.

There is a 3 mm calculus in the proximal right ureter at the L4
level. There is mild ureteral edema immediately adjacent to this
calculus in the proximal right ureter. No other ureteral calculi are
evident on either side. Urinary bladder is midline with wall
thickness within normal limits for nearly empty state.

Stomach/Bowel: There are sigmoid diverticula without diverticulitis.
There is no appreciable bowel wall or mesenteric thickening. There
is no demonstrable bowel obstruction. There is no evident free air
or portal venous air. The terminal ileum appears unremarkable.

Vascular/Lymphatic: No abdominal aortic aneurysm. No vascular
lesions are demonstrable beyond minimal calcification in the right
common iliac artery. No adenopathy evident in the abdomen or pelvis.

Reproductive: Uterus is anteverted. There is an apparent leiomyoma
in the anterior superior uterine fundus measuring 3.2 x 3.0 cm. No
extrauterine pelvic mass.

Other: The appendix appears normal. No abscess or ascites is evident
in the abdomen or pelvis. There is thinning of the rectus muscle in
the midline at the umbilicus.

Musculoskeletal: There are no blastic or lytic bone lesions. There
is degenerative change in the lower lumbar region with disc
protrusion at L5-S1. No intramuscular lesions are evident.
IMPRESSION: 1. There is a 3 x 3 mm calculus in the proximal right ureter at the
L4 level. There is slight hydronephrosis on the right. There is mild
edema in the right ureter proximally.

2.  Several nonobstructing intrarenal calculi on each side.

3. Sigmoid diverticula without diverticulitis. No bowel obstruction.
No abscess in the abdomen or pelvis. Appendix appears normal.

4. Apparent leiomyoma in the superior uterine fundus measuring
approximately 3.2 x 3.0 cm.

## 2019-12-18 ENCOUNTER — Encounter: Payer: Self-pay | Admitting: Sports Medicine

## 2019-12-18 ENCOUNTER — Ambulatory Visit (INDEPENDENT_AMBULATORY_CARE_PROVIDER_SITE_OTHER): Payer: BC Managed Care – PPO

## 2019-12-18 ENCOUNTER — Other Ambulatory Visit: Payer: Self-pay

## 2019-12-18 ENCOUNTER — Ambulatory Visit (INDEPENDENT_AMBULATORY_CARE_PROVIDER_SITE_OTHER): Payer: BC Managed Care – PPO | Admitting: Sports Medicine

## 2019-12-18 DIAGNOSIS — M79671 Pain in right foot: Secondary | ICD-10-CM | POA: Diagnosis not present

## 2019-12-18 DIAGNOSIS — M7661 Achilles tendinitis, right leg: Secondary | ICD-10-CM

## 2019-12-18 DIAGNOSIS — M778 Other enthesopathies, not elsewhere classified: Secondary | ICD-10-CM

## 2019-12-18 DIAGNOSIS — M79672 Pain in left foot: Secondary | ICD-10-CM

## 2019-12-18 MED ORDER — METHYLPREDNISOLONE 4 MG PO TBPK: ORAL_TABLET | ORAL | 0 refills | Status: DC

## 2019-12-18 NOTE — Progress Notes (Signed)
Subjective: Jessica Haney is a 59 y.o. female patient who presents to office for evaluation of Right heel pain and left 2nd toe pain. Patient complains of progressive pain especially over the last 3 weeks at the back of the heel that has come out of nowhere. Reports that pain is worse after walking dogs and pain as been so bad and the area has been really sensitive. Reports that left 2nd toe tingles and gets numb sometimes for the last 3 months and notices that when she puts pressure on the ball of her foot sometimes she feels the pain. Patient denies any other pedal complaints.   Patient Active Problem List   Diagnosis Date Noted  . Menopausal symptom 02/05/2018  . Chest pain 06/05/2016  . Hyperlipidemia 06/05/2016  . Hypothyroidism 06/05/2016  . Ruptured disc, cervical 06/05/2016    Current Outpatient Medications on File Prior to Visit  Medication Sig Dispense Refill  . amLODipine (NORVASC) 5 MG tablet Take 5 mg by mouth daily.    . influenza vac split quadrivalent PF (AFLURIA QUADRIVALENT) 0.5 ML injection Afluria Qd 2020-21 (36 mos up)(PF)60 mcg (15 mcg x4)/0.5 mL IM syringe  ADM 0.5ML IM UTD    . Influenza Vac Typ A&B Surf Ant 0.5 ML SUSY Fluvirin 2017-2018 (PF) 45 mcg(15 mcg x3)/0.5 mL intramuscular syringe  ADM 0.5ML IM UTD    . levothyroxine (SYNTHROID) 75 MCG tablet Take 75 mcg by mouth every morning.    Marland Kitchen levothyroxine (SYNTHROID, LEVOTHROID) 25 MCG tablet Take 25 mcg by mouth daily before breakfast.    . meloxicam (MOBIC) 15 MG tablet Take 1 tablet (15 mg total) by mouth daily. 30 tablet 1  . triamcinolone cream (KENALOG) 0.1 % triamcinolone acetonide 0.1 % topical cream  APPLY A SMALL AMOUNT TO THE AFFECTED AREA ON FINGER ONCE DAILY AS NEEDED    . venlafaxine XR (EFFEXOR-XR) 37.5 MG 24 hr capsule Take 37.5 mg by mouth daily.     No current facility-administered medications on file prior to visit.    Allergies  Allergen Reactions  . Acyclovir Nausea And Vomiting  .  Cyclobenzaprine Nausea And Vomiting    Objective:  General: Alert and oriented x3 in no acute distress  Dermatology: No open lesions bilateral lower extremities, no webspace macerations, no ecchymosis bilateral, all nails x 10 are well manicured. Old surgery scars well healed.   Vascular: Dorsalis Pedis and Posterior Tibial pedal pulses 2/4, Capillary Fill Time 3 seconds, + pedal hair growth bilateral, no edema bilateral lower extremities, Temperature gradient within normal limits.  Neurology: Gross sensation intact via light touch bilateral, - Moulders sign on left.  Musculoskeletal: Mild tenderness with palpation at insertion of the Achilles on Right posterior heel at medial aspect, there is no calcaneal exostosis with mild soft tissue swelling present and decreased ankle rom with knee extending  vs flexed resembling gastroc equnius bilateral, The achilles tendon feels intact with no nodularity or palpable dell, Thompson sign negative, Subtalar and midtarsal joint range of motion is within normal limits, there is no 1st ray hypermobility or forefoot deformity noted bilateral except on left there is limited range at 1st MTPJ only about 40 deg on left with likely 2nd MTPJ overload on left. No reproducible pain on left.   Gait: Antalgic gait with increased heel off right  Xrays  Right/Left Foot    Impression: Normal osseous mineralization. Joint spaces preserved except at 5th MTPJs consistent with bunionette. No fracture/dislocation/boney destruction. Calcaneal spur present. Kager's triangle intact with no  obliteration. No soft tissue abnormalities or radiopaque foreign bodies.   Assessment and Plan: Problem List Items Addressed This Visit    None    Visit Diagnoses    Pain in left foot    -  Primary   Relevant Orders   DG Foot Complete Left   Pain in right foot       Relevant Orders   DG Foot Complete Right   Tendonitis, Achilles, right       Capsulitis of foot, left           -Complete examination performed -Xrays reviewed -Discussed treatement options -Rx Medrol dose pack -Dispensed ice pack and night splint to use as instructed on the right -Dispensed met pad to use on left -No improvement will consider MRI/PT/EPAT -Patient to return to office if no better after 1 month or sooner if condition worsens.  Landis Martins, DPM

## 2019-12-23 ENCOUNTER — Other Ambulatory Visit: Payer: Self-pay | Admitting: Sports Medicine

## 2019-12-23 DIAGNOSIS — M778 Other enthesopathies, not elsewhere classified: Secondary | ICD-10-CM

## 2019-12-23 DIAGNOSIS — M7661 Achilles tendinitis, right leg: Secondary | ICD-10-CM

## 2020-01-29 ENCOUNTER — Encounter: Payer: Self-pay | Admitting: Sports Medicine

## 2020-01-29 ENCOUNTER — Other Ambulatory Visit: Payer: Self-pay

## 2020-01-29 ENCOUNTER — Ambulatory Visit (INDEPENDENT_AMBULATORY_CARE_PROVIDER_SITE_OTHER): Payer: BC Managed Care – PPO | Admitting: Sports Medicine

## 2020-01-29 DIAGNOSIS — L719 Rosacea, unspecified: Secondary | ICD-10-CM | POA: Insufficient documentation

## 2020-01-29 DIAGNOSIS — K5792 Diverticulitis of intestine, part unspecified, without perforation or abscess without bleeding: Secondary | ICD-10-CM | POA: Insufficient documentation

## 2020-01-29 DIAGNOSIS — M79671 Pain in right foot: Secondary | ICD-10-CM

## 2020-01-29 DIAGNOSIS — M8580 Other specified disorders of bone density and structure, unspecified site: Secondary | ICD-10-CM | POA: Insufficient documentation

## 2020-01-29 DIAGNOSIS — I10 Essential (primary) hypertension: Secondary | ICD-10-CM | POA: Insufficient documentation

## 2020-01-29 DIAGNOSIS — M1990 Unspecified osteoarthritis, unspecified site: Secondary | ICD-10-CM | POA: Insufficient documentation

## 2020-01-29 DIAGNOSIS — M7661 Achilles tendinitis, right leg: Secondary | ICD-10-CM

## 2020-01-29 DIAGNOSIS — C44310 Basal cell carcinoma of skin of unspecified parts of face: Secondary | ICD-10-CM | POA: Insufficient documentation

## 2020-01-29 MED ORDER — TRIAMCINOLONE ACETONIDE 10 MG/ML IJ SUSP
10.0000 mg | Freq: Once | INTRAMUSCULAR | Status: AC
  Administered 2020-01-29: 09:00:00 10 mg

## 2020-01-29 NOTE — Progress Notes (Signed)
Subjective: Jessica Haney is a 59 y.o. female patient who presents to office for evaluation of Right heel pain. Patient reports pain is the same boot and steriod pak has not helped. Still worse with shoes and reports that she lost heel lifts provided last visit. Patient denies any other pedal complaints.   Patient Active Problem List   Diagnosis Date Noted  . Arthritis 01/29/2020  . Basal cell carcinoma of face 01/29/2020  . Diverticulitis 01/29/2020  . Essential hypertension 01/29/2020  . Osteopenia 01/29/2020  . Rosacea 01/29/2020  . Menopausal symptom 02/05/2018  . Chest pain 06/05/2016  . Hyperlipidemia 06/05/2016  . Hypothyroidism 06/05/2016  . Ruptured disc, cervical 06/05/2016    Current Outpatient Medications on File Prior to Visit  Medication Sig Dispense Refill  . amLODipine (NORVASC) 5 MG tablet Take 5 mg by mouth daily.    Marland Kitchen ibuprofen (ADVIL) 200 MG tablet 1 tablet with food or milk as needed    . influenza vac split quadrivalent PF (AFLURIA QUADRIVALENT) 0.5 ML injection Afluria Qd 2020-21 (36 mos up)(PF)60 mcg (15 mcg x4)/0.5 mL IM syringe  ADM 0.5ML IM UTD    . Influenza Vac Typ A&B Surf Ant 0.5 ML SUSY Fluvirin 2017-2018 (PF) 45 mcg(15 mcg x3)/0.5 mL intramuscular syringe  ADM 0.5ML IM UTD    . levothyroxine (SYNTHROID) 75 MCG tablet Take 75 mcg by mouth every morning.    Marland Kitchen levothyroxine (SYNTHROID, LEVOTHROID) 25 MCG tablet Take 25 mcg by mouth daily before breakfast.    . loratadine (CLARITIN) 10 MG tablet 1 tablet    . meloxicam (MOBIC) 15 MG tablet Take 1 tablet (15 mg total) by mouth daily. 30 tablet 1  . methylPREDNISolone (MEDROL DOSEPAK) 4 MG TBPK tablet 6 day dose pack - take as directed 21 tablet 0  . metroNIDAZOLE (METROGEL) 1 % gel 1 application to affected area    . Multiple Minerals-Vitamins (CITRACAL PLUS) TABS See admin instructions.    . naproxen sodium (ALEVE) 220 MG tablet 1 tablet with food or milk as needed    . triamcinolone cream (KENALOG) 0.1  % triamcinolone acetonide 0.1 % topical cream  APPLY A SMALL AMOUNT TO THE AFFECTED AREA ON FINGER ONCE DAILY AS NEEDED    . venlafaxine XR (EFFEXOR-XR) 37.5 MG 24 hr capsule Take 37.5 mg by mouth daily.     No current facility-administered medications on file prior to visit.    Allergies  Allergen Reactions  . Acyclovir Nausea And Vomiting  . Cyclobenzaprine Nausea And Vomiting    Objective:  General: Alert and oriented x3 in no acute distress  Dermatology: No open lesions bilateral lower extremities, no webspace macerations, no ecchymosis bilateral, all nails x 10 are well manicured.  Vascular: Dorsalis Pedis and Posterior Tibial pedal pulses 2/4, Capillary Fill Time 3 seconds, + pedal hair growth bilateral, no edema bilateral lower extremities, Temperature gradient within normal limits.  Neurology: Johney Maine sensation intact via light touch bilateral.   Musculoskeletal: Mild to moderate tenderness with palpation at insertion of the Achilles on Right, Achilles tendon feels intact with no nodularity or palpable dell, Thompson sign negative, Limited ankle range of motion bilateral.   Assessment and Plan: Problem List Items Addressed This Visit   None   Visit Diagnoses    Tendonitis, Achilles, right    -  Primary   Relevant Medications   triamcinolone acetonide (KENALOG) 10 MG/ML injection 10 mg (Completed)   Pain in right foot          -  Complete examination performed -Previous Xrays reviewed -Discussed treatment options -After oral consent and aseptic prep, injected a mixture containing 1 ml of 2%  plain lidocaine, 1 ml 0.5% plain marcaine, 0.5 ml of kenalog 10 and 0.5 ml of dexamethasone phosphate at right achilles insertion medial aspect without complication. Post-injection care discussed with patient.  -Advised rest and boot that she has at home for today -Dispened heel lifts to use in tennis shoes on tomorrow -Continue gentle stretching with night splint and icing -No  improvement will consider MRI/PT/EPAT -Patient to return to office 1 month or sooner if condition worsens.  Landis Martins, DPM

## 2020-01-30 ENCOUNTER — Encounter: Payer: Self-pay | Admitting: Sports Medicine

## 2020-02-26 ENCOUNTER — Ambulatory Visit: Payer: BC Managed Care – PPO | Admitting: Sports Medicine

## 2021-08-12 ENCOUNTER — Other Ambulatory Visit: Payer: Self-pay | Admitting: Obstetrics and Gynecology

## 2021-08-12 DIAGNOSIS — R928 Other abnormal and inconclusive findings on diagnostic imaging of breast: Secondary | ICD-10-CM

## 2021-08-23 ENCOUNTER — Ambulatory Visit
Admission: RE | Admit: 2021-08-23 | Discharge: 2021-08-23 | Disposition: A | Discharge status: Home / Self Care | Source: Ambulatory Visit | Attending: Obstetrics and Gynecology | Admitting: Obstetrics and Gynecology

## 2021-08-23 ENCOUNTER — Other Ambulatory Visit: Payer: Self-pay | Admitting: Obstetrics and Gynecology

## 2021-08-23 DIAGNOSIS — N6310 Unspecified lump in the right breast, unspecified quadrant: Secondary | ICD-10-CM

## 2021-08-23 DIAGNOSIS — R928 Other abnormal and inconclusive findings on diagnostic imaging of breast: Secondary | ICD-10-CM

## 2021-08-26 ENCOUNTER — Ambulatory Visit
Admission: RE | Admit: 2021-08-26 | Discharge: 2021-08-26 | Disposition: A | Discharge status: Home / Self Care | Source: Ambulatory Visit | Attending: Obstetrics and Gynecology | Admitting: Obstetrics and Gynecology

## 2021-08-26 DIAGNOSIS — N6310 Unspecified lump in the right breast, unspecified quadrant: Secondary | ICD-10-CM

## 2021-12-12 ENCOUNTER — Emergency Department (HOSPITAL_BASED_OUTPATIENT_CLINIC_OR_DEPARTMENT_OTHER): Payer: BC Managed Care – PPO | Admitting: Radiology

## 2021-12-12 ENCOUNTER — Emergency Department (HOSPITAL_BASED_OUTPATIENT_CLINIC_OR_DEPARTMENT_OTHER)
Admission: EM | Admit: 2021-12-12 | Discharge: 2021-12-12 | Disposition: A | Discharge status: Home / Self Care | Payer: BC Managed Care – PPO | Attending: Emergency Medicine | Admitting: Emergency Medicine

## 2021-12-12 ENCOUNTER — Encounter (HOSPITAL_BASED_OUTPATIENT_CLINIC_OR_DEPARTMENT_OTHER): Payer: Self-pay | Admitting: Emergency Medicine

## 2021-12-12 ENCOUNTER — Emergency Department (HOSPITAL_BASED_OUTPATIENT_CLINIC_OR_DEPARTMENT_OTHER): Payer: BC Managed Care – PPO

## 2021-12-12 ENCOUNTER — Other Ambulatory Visit: Payer: Self-pay

## 2021-12-12 DIAGNOSIS — E039 Hypothyroidism, unspecified: Secondary | ICD-10-CM | POA: Insufficient documentation

## 2021-12-12 DIAGNOSIS — R0789 Other chest pain: Secondary | ICD-10-CM | POA: Insufficient documentation

## 2021-12-12 DIAGNOSIS — Z79899 Other long term (current) drug therapy: Secondary | ICD-10-CM | POA: Insufficient documentation

## 2021-12-12 DIAGNOSIS — R42 Dizziness and giddiness: Secondary | ICD-10-CM | POA: Diagnosis not present

## 2021-12-12 DIAGNOSIS — I10 Essential (primary) hypertension: Secondary | ICD-10-CM | POA: Insufficient documentation

## 2021-12-12 HISTORY — DX: Restless legs syndrome: G25.81

## 2021-12-12 HISTORY — DX: Essential (primary) hypertension: I10

## 2021-12-12 LAB — CBC
HCT: 43.4 % (ref 36.0–46.0)
Hemoglobin: 14.5 g/dL (ref 12.0–15.0)
MCH: 31.8 pg (ref 26.0–34.0)
MCHC: 33.4 g/dL (ref 30.0–36.0)
MCV: 95.2 fL (ref 80.0–100.0)
Platelets: 222 10*3/uL (ref 150–400)
RBC: 4.56 MIL/uL (ref 3.87–5.11)
RDW: 12.5 % (ref 11.5–15.5)
WBC: 6.7 10*3/uL (ref 4.0–10.5)
nRBC: 0 % (ref 0.0–0.2)

## 2021-12-12 LAB — BASIC METABOLIC PANEL
Anion gap: 13 (ref 5–15)
BUN: 18 mg/dL (ref 8–23)
CO2: 23 mmol/L (ref 22–32)
Calcium: 9.6 mg/dL (ref 8.9–10.3)
Chloride: 102 mmol/L (ref 98–111)
Creatinine, Ser: 0.68 mg/dL (ref 0.44–1.00)
GFR, Estimated: >60 mL/min (ref >60)
Glucose, Bld: 150 mg/dL — ABNORMAL HIGH (ref 70–99)
Potassium: 3.8 mmol/L (ref 3.5–5.1)
Sodium: 138 mmol/L (ref 135–145)

## 2021-12-12 LAB — TROPONIN I (HIGH SENSITIVITY): Troponin I (High Sensitivity): 2 ng/L (ref <18)

## 2021-12-12 MED ORDER — LORAZEPAM 1 MG PO TABS
1.0000 mg | ORAL_TABLET | Freq: Once | ORAL | Status: AC
  Administered 2021-12-12: 1 mg via ORAL
  Filled 2021-12-12: qty 1

## 2021-12-12 NOTE — ED Provider Notes (Signed)
[facility] Provider Note   CSN: [id number] Arrival date & time: 12/12/21  1037     History {Add pertinent medical, surgical, social history, OB history to HPI:1} Chief Complaint  Patient presents with   Chest Pain   Lightheaded    Jessica Haney is a 61 y.o. female.   Chest Pain      Home Medications Prior to Admission medications   Medication Sig Start Date End Date Taking? Authorizing Provider  amLODipine (NORVASC) 5 MG tablet Take 5 mg by mouth daily. 11/13/19   [provider]  ibuprofen (ADVIL) 200 MG tablet 1 tablet with food or milk as needed    [provider]  influenza vac split quadrivalent PF (AFLURIA QUADRIVALENT) 0.5 ML injection Afluria Qd 2020-21 (36 mos up)(PF)60 mcg (15 mcg x4)/0.5 mL IM syringe  ADM 0.5ML IM UTD    [provider]  Influenza Vac Typ A&B Surf Ant 0.5 ML SUSY Fluvirin 2017-2018 (PF) 45 mcg(15 mcg x3)/0.5 mL intramuscular syringe  ADM 0.5ML IM UTD    [provider]  levothyroxine (SYNTHROID) 75 MCG tablet Take 75 mcg by mouth every morning. 11/08/19   [provider]  levothyroxine (SYNTHROID, LEVOTHROID) 25 MCG tablet Take 25 mcg by mouth daily before breakfast.    [provider]  loratadine (CLARITIN) 10 MG tablet 1 tablet    [provider]  meloxicam (MOBIC) 15 MG tablet Take 1 tablet (15 mg total) by mouth daily. 08/15/19   Edrick Kins, DPM  methylPREDNISolone (MEDROL DOSEPAK) 4 MG TBPK tablet 6 day dose pack - take as directed 12/18/19   Landis Martins, DPM  metroNIDAZOLE (METROGEL) 1 % gel 1 application to affected area 05/22/16   [provider]  Multiple Minerals-Vitamins (CITRACAL PLUS) TABS See admin instructions.    [provider]  naproxen sodium (ALEVE) 220 MG tablet 1 tablet with food or milk as needed    [provider]  triamcinolone cream (KENALOG) 0.1 % triamcinolone acetonide 0.1 % topical cream  APPLY A  SMALL AMOUNT TO THE AFFECTED AREA ON FINGER ONCE DAILY AS NEEDED    [provider]  venlafaxine XR (EFFEXOR-XR) 37.5 MG 24 hr capsule Take 37.5 mg by mouth daily. 09/04/19   [provider]      Allergies    Acyclovir and Cyclobenzaprine    Review of Systems   Review of Systems  Cardiovascular:  Positive for chest pain.    Physical Exam Updated Vital Signs BP (!) 166/100   Pulse 85   Temp 98.2 F (36.8 C)   Resp 13   Ht '5\' 4"'$  (1.626 m)   Wt 68.9 kg   SpO2 100%   BMI 26.09 kg/m  Physical Exam  ED Results / Procedures / Treatments   Labs (all labs ordered are listed, but only abnormal results are displayed) Labs Reviewed  BASIC METABOLIC PANEL - Abnormal; Notable for the following components:      Result Value   Glucose, Bld 150 (*)    All other components within normal limits  CBC  TROPONIN I (HIGH SENSITIVITY)  TROPONIN I (HIGH SENSITIVITY)    EKG None  Radiology DG Chest 2 View  Result Date: 12/12/2021 CLINICAL DATA:  Chest pain and tightness. EXAM: CHEST - 2 VIEW COMPARISON:  01/29/2019 FINDINGS: Heart size is normal. Mediastinal shadows are normal. Question central bronchial thickening. No infiltrate, collapse or effusion. No acute bone finding. IMPRESSION: Possible bronchitis. No consolidation or collapse. Electronically Signed  By: Nelson Chimes M.D.   On: 12/12/2021 11:16    Procedures Procedures  {Document cardiac monitor, telemetry assessment procedure when appropriate:1}  Medications Ordered in ED Medications - No data to display  ED Course/ Medical Decision Making/ A&P                           Medical Decision Making Amount and/or Complexity of Data Reviewed Labs: ordered. Radiology: ordered.   ***  {Document critical care time when appropriate:1} {Document review of labs and clinical decision tools ie heart score, Chads2Vasc2 etc:1}  {Document your independent review of radiology images, and any outside  records:1} {Document your discussion with family members, caretakers, and with consultants:1} {Document social determinants of health affecting pt's care:1} {Document your decision making why or why not admission, treatments were needed:1} Final Clinical Impression(s) / ED Diagnoses Final diagnoses:  None    Rx / DC Orders ED Discharge Orders     None

## 2021-12-12 NOTE — ED Triage Notes (Signed)
Pt arrives to ED with c/o chest tightness and lightheadedness. The chest tightness started seven days ago and has been constant x3 days. She does note to be lightheaded as well. She reports her blood pressure has been elevated at home.

## 2022-01-11 NOTE — Progress Notes (Unsigned)
Cardiology Office Note   Date:  01/12/2022   ID:  Leoda, [place]hart Jan 24, 1961, MRN [id number]  PCP:  Saintclair Halsted, FNP  Cardiologist:   Minus Breeding, MD Referring:  Saintclair Halsted, FNP   Chief Complaint  Patient presents with   Chest Pain      History of Present Illness: Jessica Haney is a 61 y.o. female who presents for evaluation of chest pain.   She is referred by  Saintclair Halsted, FNP.  She was in the emergency room in November with chest discomfort.  I reviewed these records for this visit.  Troponin x 1 was negative.  She also had lightheadedness and had a negative head CT.  She said that this comes and goes.  Since she was in the emergency room she has noted that it could have been related to some caffeine because that seems to make it worse.  However, it does not completely related to that.  It seems to happen at rest.  It is there when she thinks about it.  If she is distracted she might not notice it.  It can be 5 out of 10 in intensity.  It is a tightness.  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  She walks the dog and it does not get worse with this.  She is not describing radiation to her jaw or to her arms.  She is not having any associated shortness of breath, PND or orthopnea.  She has had no nausea or vomiting.  Of note she did have a negative POET in 2018.  She has otherwise never had cardiac workup for further testing.  Past Medical History:  Diagnosis Date   Hypertension    Hypothyroidism    Restless leg syndrome    Rosacea     Past Surgical History:  Procedure Laterality Date   BUNIONECTOMY     VEIN SURGERY Right      Current Outpatient Medications  Medication Sig Dispense Refill   amLODipine (NORVASC) 5 MG tablet Take 5 mg by mouth daily.     ibuprofen (ADVIL) 200 MG tablet 1 tablet with food or milk as needed     influenza vac split quadrivalent PF (AFLURIA QUADRIVALENT) 0.5 ML injection Afluria Qd 2020-21 (36 mos up)(PF)60 mcg (15  mcg x4)/0.5 mL IM syringe  ADM 0.5ML IM UTD     Influenza Vac Typ A&B Surf Ant 0.5 ML SUSY Fluvirin 2017-2018 (PF) 45 mcg(15 mcg x3)/0.5 mL intramuscular syringe  ADM 0.5ML IM UTD     levothyroxine (SYNTHROID) 75 MCG tablet Take 75 mcg by mouth every morning.     metroNIDAZOLE (METROGEL) 1 % gel 1 application to affected area     venlafaxine XR (EFFEXOR-XR) 37.5 MG 24 hr capsule Take 37.5 mg by mouth daily.     No current facility-administered medications for this visit.    Allergies:   Acyclovir and Cyclobenzaprine    Social History:  The patient  reports that she has never smoked. She has never used smokeless tobacco.   Family History:  The patient's family history includes Breast cancer (age of onset: 86) in her sister; Breast cancer (age of onset: 31) in her mother; Emphysema in her father; Hyperlipidemia in her father and mother; Hypertension in her father and mother.    ROS:  Please see the history of present illness.   Otherwise, review of systems are positive for none.   All other systems are  reviewed and negative.    PHYSICAL EXAM: VS:  BP 120/82   Pulse 71   Ht '5\' 4"'$  (1.626 m)   Wt 155 lb (70.3 kg)   SpO2 94%   BMI 26.61 kg/m  , BMI Body mass index is 26.61 kg/m. GENERAL:  Well appearing HEENT:  Pupils equal round and reactive, fundi not visualized, oral mucosa unremarkable NECK:  No jugular venous distention, waveform within normal limits, carotid upstroke brisk and symmetric, no bruits, no thyromegaly LYMPHATICS:  No cervical, inguinal adenopathy LUNGS:  Clear to auscultation bilaterally BACK:  No CVA tenderness CHEST:  Unremarkable HEART:  PMI not displaced or sustained,S1 and S2 within normal limits, no S3, no S4, no clicks, no rubs, no murmurs ABD:  Flat, positive bowel sounds normal in frequency in pitch, no bruits, no rebound, no guarding, no midline pulsatile mass, no hepatomegaly, no splenomegaly EXT:  2 plus pulses throughout, no edema, no cyanosis no  clubbing SKIN:  No rashes no nodules NEURO:  Cranial nerves II through XII grossly intact, motor grossly intact throughout PSYCH:  Cognitively intact, oriented to person place and time    EKG:  EKG is ordered today. The ekg ordered today demonstrates sinus rhythm, rate 73, left axis deviation, poor anterior R wave progression, no acute ST-T wave changes.   Recent Labs: 12/12/2021: BUN 18; Creatinine, Ser 0.68; Hemoglobin 14.5; Platelets 222; Potassium 3.8; Sodium 138    Lipid Panel No results found for: "CHOL", "TRIG", "HDL", "CHOLHDL", "VLDL", "LDLCALC", "LDLDIRECT"    Wt Readings from Last 3 Encounters:  01/12/22 155 lb (70.3 kg)  12/12/21 152 lb (68.9 kg)  01/29/19 155 lb (70.3 kg)      Other studies Reviewed: Additional studies/ records that were reviewed today include: ED records. Review of the above records demonstrates:  Please see elsewhere in the note.     ASSESSMENT AND PLAN:  Chest pain: She has nonanginal greater than anginal features.  I think the pretest probability of obstructive coronary disease is on the lower side.  I am going to order a coronary calcium score and a POET (Plain Old Exercise Treadmill).  Further testing and goals of therapy will be based on this.  Dyslipidemia: LDL is 157.  She was just started on atorvastatin.  Again goals of therapy as above.  Hypertension: She reports that her diastolic slightly elevated at home.  She is going to keep a close eye on this and might need 7 and half milligrams of amlodipine.   Current medicines are reviewed at length with the patient today.  The patient does not have concerns regarding medicines.  The following changes have been made:  no change  Labs/ tests ordered today include:   Orders Placed This Encounter  Procedures   CT CARDIAC SCORING (SELF PAY ONLY)   Exercise Tolerance Test   EKG 12-Lead     Disposition:   FU with me as needed    Signed, Minus Breeding, MD  01/12/2022 9:16 AM     [place]

## 2022-01-12 ENCOUNTER — Encounter: Payer: Self-pay | Admitting: Cardiology

## 2022-01-12 ENCOUNTER — Ambulatory Visit: Payer: BC Managed Care – PPO | Attending: Cardiology | Admitting: Cardiology

## 2022-01-12 VITALS — BP 120/82 | HR 71 | Ht 64.0 in | Wt 155.0 lb

## 2022-01-12 DIAGNOSIS — R072 Precordial pain: Secondary | ICD-10-CM

## 2022-01-12 NOTE — Patient Instructions (Signed)
Medication Instructions:  Your physician recommends that you continue on your current medications as directed. Please refer to the Current Medication list given to you today.  *If you need a refill on your cardiac medications before your next appointment, please call your pharmacy*   Lab Work: NONE If you have labs (blood work) drawn today and your tests are completely normal, you will receive your results only by: [place] (if you have MyChart) OR A paper copy in the mail If you have any lab test that is abnormal or we need to change your treatment, we will call you to review the results.   Testing/Procedures: Dr. Percival Spanish has ordered a CT coronary calcium score.   Test locations:  [place]   This is $99 out of pocket.   Coronary CalciumScan A coronary calcium scan is an imaging test used to look for deposits of calcium and other fatty materials (plaques) in the inner lining of the blood vessels of the heart (coronary arteries). These deposits of calcium and plaques can partly clog and narrow the coronary arteries without producing any symptoms or warning signs. This puts a person at risk for a heart attack. This test can detect these deposits before symptoms develop. Tell a health care provider about: Any allergies you have. All medicines you are taking, including vitamins, herbs, eye drops, creams, and over-the-counter medicines. Any problems you or family members have had with anesthetic medicines. Any blood disorders you have. Any surgeries you have had. Any medical conditions you have. Whether you are pregnant or may be pregnant. What are the risks? Generally, this is a safe procedure. However, problems may occur, including: Harm to a pregnant woman and her unborn baby. This test involves the use of radiation. Radiation exposure can be dangerous to a pregnant woman and her unborn baby. If you are pregnant, you generally should not have  this procedure done. Slight increase in the risk of cancer. This is because of the radiation involved in the test. What happens before the procedure? No preparation is needed for this procedure. What happens during the procedure? You will undress and remove any jewelry around your neck or chest. You will put on a hospital gown. Sticky electrodes will be placed on your chest. The electrodes will be connected to an electrocardiogram (ECG) machine to record a tracing of the electrical activity of your heart. A CT scanner will take pictures of your heart. During this time, you will be asked to lie still and hold your breath for 2-3 seconds while a picture of your heart is being taken. The procedure may vary among health care providers and hospitals. What happens after the procedure? You can get dressed. You can return to your normal activities. It is up to you to get the results of your test. Ask your health care provider, or the department that is doing the test, when your results will be ready. Summary A coronary calcium scan is an imaging test used to look for deposits of calcium and other fatty materials (plaques) in the inner lining of the blood vessels of the heart (coronary arteries). Generally, this is a safe procedure. Tell your health care provider if you are pregnant or may be pregnant. No preparation is needed for this procedure. A CT scanner will take pictures of your heart. You can return to your normal activities after the scan is done. This information is not intended to replace advice given to you by your health care provider.  Make sure you discuss any questions you have with your health care provider. Document Released: 07/22/2007 Document Revised: 12/13/2015 Document Reviewed: 12/13/2015 Elsevier Interactive Patient Education  2017 [place] physician has requested that you have an exercise tolerance test. For further information please visit [email or web address].  Please also follow instruction sheet, as given.     Follow-Up: At [facility], you and your health needs are our priority.  As part of our continuing mission to provide you with exceptional heart care, we have created designated Provider Care Teams.  These Care Teams include your primary Cardiologist (physician) and Advanced Practice Providers (APPs -  Physician Assistants and Nurse Practitioners) who all work together to provide you with the care you need, when you need it.  We recommend signing up for the patient portal called "MyChart".  Sign up information is provided on this After Visit Summary.  MyChart is used to connect with patients for Virtual Visits (Telemedicine).  Patients are able to view lab/test results, encounter notes, upcoming appointments, etc.  Non-urgent messages can be sent to your provider as well.   To learn more about what you can do with MyChart, go to [email or web address].    Your next appointment:   As needed   The format for your next appointment:   In Person  Provider:   Minus Breeding, MD

## 2022-01-27 ENCOUNTER — Telehealth (HOSPITAL_COMMUNITY): Payer: Self-pay | Admitting: *Deleted

## 2022-01-27 ENCOUNTER — Encounter: Payer: Self-pay | Admitting: Diagnostic Neuroimaging

## 2022-01-27 ENCOUNTER — Ambulatory Visit (INDEPENDENT_AMBULATORY_CARE_PROVIDER_SITE_OTHER): Payer: BC Managed Care – PPO | Admitting: Diagnostic Neuroimaging

## 2022-01-27 VITALS — BP 112/85 | HR 75 | Ht 64.0 in | Wt 154.6 lb

## 2022-01-27 DIAGNOSIS — R42 Dizziness and giddiness: Secondary | ICD-10-CM | POA: Diagnosis not present

## 2022-01-27 NOTE — Patient Instructions (Signed)
LIGHTHEADEDNESS, CHEST PRESSURE (intermittent; now resolved) - monitor; med mgmt - CT head is unremarkable; neuro exam normal; no other neurologic concern at this time

## 2022-01-27 NOTE — Telephone Encounter (Signed)
Spoke with patient about her scheduled ETT on 02/03/22. She was instructed to wear sneakers and comfortable clothes and to arrive 15 minutes earlier than her scheduled visit.

## 2022-01-27 NOTE — Progress Notes (Signed)
[facility]  PATIENT: Jessica Haney DOB: 1960-12-30  REFERRING CLINICIAN: Saintclair Halsted, FNP HISTORY FROM: patient  REASON FOR VISIT: new consult   HISTORICAL  CHIEF COMPLAINT:  Chief Complaint  Patient presents with   Room 6    Pt is here Alone. Pt states that she doesn't have any headaches or dizziness. Pt states no blurry vision.     HISTORY OF PRESENT ILLNESS:   61 year old female here for evaluation of lightheadedness and dizziness.  November 2023 patient had intermittent episodes of lightheadedness and dizziness with palpitations.  She went to the emergency room for evaluation.  Lab testing, EKG, CT were unremarkable.  She followed up with cardiology.  CT head report was unremarkable except for mention of intracranial atherosclerosis on the report body.  Patient referred here for further evaluation of this finding.    REVIEW OF SYSTEMS: Full 14 system review of systems performed and negative with exception of: as per HPI.  ALLERGIES: Allergies  Allergen Reactions   Acyclovir Nausea And Vomiting   Cyclobenzaprine Nausea And Vomiting    HOME MEDICATIONS: Outpatient Medications Prior to Visit  Medication Sig Dispense Refill   amLODipine (NORVASC) 5 MG tablet Take 5 mg by mouth daily.     atorvastatin (LIPITOR) 10 MG tablet Take 10 mg by mouth daily.     ibuprofen (ADVIL) 200 MG tablet 1 tablet with food or milk as needed     influenza vac split quadrivalent PF (AFLURIA QUADRIVALENT) 0.5 ML injection Afluria Qd 2020-21 (36 mos up)(PF)60 mcg (15 mcg x4)/0.5 mL IM syringe  ADM 0.5ML IM UTD     Influenza Vac Typ A&B Surf Ant 0.5 ML SUSY Fluvirin 2017-2018 (PF) 45 mcg(15 mcg x3)/0.5 mL intramuscular syringe  ADM 0.5ML IM UTD     levothyroxine (SYNTHROID) 75 MCG tablet Take 75 mcg by mouth every morning.     metroNIDAZOLE (METROGEL) 1 % gel 1 application to affected area     rOPINIRole (REQUIP) 0.25 MG tablet Take 0.25 mg by mouth at bedtime.      venlafaxine XR (EFFEXOR-XR) 37.5 MG 24 hr capsule Take 37.5 mg by mouth daily.     venlafaxine XR (EFFEXOR-XR) 75 MG 24 hr capsule Take 75 mg by mouth daily.     No facility-administered medications prior to visit.    PAST MEDICAL HISTORY: Past Medical History:  Diagnosis Date   Hypertension    Hypothyroidism    Restless leg syndrome    Rosacea     PAST SURGICAL HISTORY: Past Surgical History:  Procedure Laterality Date   BUNIONECTOMY     VEIN SURGERY Right     FAMILY HISTORY: Family History  Problem Relation Age of Onset   Breast cancer Mother 97   Hyperlipidemia Mother    Hypertension Mother    Hypertension Father    Hyperlipidemia Father    Emphysema Father    Breast cancer Sister 70    SOCIAL HISTORY: Social History   Socioeconomic History   Marital status: Married    Spouse name: Not on file   Number of children: Not on file   Years of education: Not on file   Highest education level: Not on file  Occupational History   Not on file  Tobacco Use   Smoking status: Never   Smokeless tobacco: Never  Substance and Sexual Activity   Alcohol use: Not on file   Drug use: Not on file   Sexual activity: Not on file  Other Topics Concern  Not on file  Social History 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      Social Determinants of Health   Financial Resource Strain: Not on file  Food Insecurity: Not on file  Transportation Needs: Not on file  Physical Activity: Not on file  Stress: Not on file  Social Connections: Not on file  Intimate Partner Violence: Not on file     PHYSICAL EXAM  GENERAL EXAM/CONSTITUTIONAL: Vitals:  Vitals:   01/27/22 1049  BP: 112/85  Pulse: 75  Weight: 154 lb 9.6 oz (70.1 kg)  Height: '5\' 4"'$  (1.626 m)   Body mass index is 26.54 kg/m. Wt Readings from Last 3 Encounters:  01/27/22 154 lb 9.6 oz (70.1 kg)  01/12/22 155 lb (70.3 kg)  12/12/21 152 lb (68.9 kg)   Patient is in no distress; well developed, nourished  and groomed; neck is supple  CARDIOVASCULAR: Examination of carotid arteries is normal; no carotid bruits Regular rate and rhythm, no murmurs Examination of peripheral vascular system by observation and palpation is normal  EYES: Ophthalmoscopic exam of optic discs and posterior segments is normal; no papilledema or hemorrhages No results found.  MUSCULOSKELETAL: Gait, strength, tone, movements noted in Neurologic exam below  NEUROLOGIC: MENTAL STATUS:      No data to display         awake, alert, oriented to person, place and time recent and remote memory intact normal attention and concentration language fluent, comprehension intact, naming intact fund of knowledge appropriate  CRANIAL NERVE:  2nd - no papilledema on fundoscopic exam 2nd, 3rd, 4th, 6th - pupils equal and reactive to light, visual fields full to confrontation, extraocular muscles intact, no nystagmus 5th - facial sensation symmetric 7th - facial strength symmetric 8th - hearing intact 9th - palate elevates symmetrically, uvula midline 11th - shoulder shrug symmetric 12th - tongue protrusion midline  MOTOR:  normal bulk and tone, full strength in the BUE, BLE  SENSORY:  normal and symmetric to light touch, temperature, vibration  COORDINATION:  finger-nose-finger, fine finger movements normal  REFLEXES:  deep tendon reflexes present and symmetric  GAIT/STATION:  narrow based gait     DIAGNOSTIC DATA (LABS, IMAGING, TESTING) - I reviewed patient records, labs, notes, testing and imaging myself where available.  Lab Results  Component Value Date   WBC 6.7 12/12/2021   HGB 14.5 12/12/2021   HCT 43.4 12/12/2021   MCV 95.2 12/12/2021   PLT 222 12/12/2021      Component Value Date/Time   NA 138 12/12/2021 1102   K 3.8 12/12/2021 1102   CL 102 12/12/2021 1102   CO2 23 12/12/2021 1102   GLUCOSE 150 (H) 12/12/2021 1102   BUN 18 12/12/2021 1102   CREATININE 0.68 12/12/2021 1102    CALCIUM 9.6 12/12/2021 1102   GFRNONAA >60 12/12/2021 1102   GFRAA >60 01/29/2019 1633   No results found for: "CHOL", "HDL", "LDLCALC", "LDLDIRECT", "TRIG", "CHOLHDL" No results found for: "HGBA1C" No results found for: "VITAMINB12" No results found for: "TSH"   12/12/21 CT head [I reviewed images myself and agree with interpretation. Intracranial atherosclerosis is mentioned on report, but I do not see any significant calcifications or evidence of this. -VR[facility]]  - No acute intracranial abnormality.     ASSESSMENT AND PLAN  61 y.o. year old female here with:   Dx:  1. Lightheadedness       PLAN:  LIGHTHEADEDNESS, CHEST PRESSURE (intermittent; now resolved) - monitor; med mgmt; BP control, statin -  CT head is unremarkable; neuro exam normal; no other neurologic concern at this time  Return for return to PCP.    Penni Bombard, MD 35/39/1225, 83:46 PM Certified in Neurology, Neurophysiology and Neuroimaging  [facility] [address] [place], [place] [address] [phone]

## 2022-02-03 ENCOUNTER — Ambulatory Visit (HOSPITAL_COMMUNITY): Payer: BC Managed Care – PPO | Attending: Cardiology

## 2022-02-03 DIAGNOSIS — R072 Precordial pain: Secondary | ICD-10-CM

## 2022-02-07 LAB — EXERCISE TOLERANCE TEST
Angina Index: 0
Duke Treadmill Score: 9
Estimated workload: 10.5
Exercise duration (min): 9 min
Exercise duration (sec): 16 s
MPHR: 159 {beats}/min
Peak HR: 151 {beats}/min
Percent HR: 94 %
RPE: 17
Rest HR: 83 {beats}/min
ST Depression (mm): 0.5 mm

## 2022-02-20 ENCOUNTER — Ambulatory Visit (HOSPITAL_COMMUNITY)
Admission: RE | Admit: 2022-02-20 | Discharge: 2022-02-20 | Disposition: A | Discharge status: Home / Self Care | Payer: BC Managed Care – PPO | Source: Ambulatory Visit | Attending: Cardiology | Admitting: Cardiology

## 2022-02-20 DIAGNOSIS — R072 Precordial pain: Secondary | ICD-10-CM | POA: Insufficient documentation

## 2022-09-06 ENCOUNTER — Other Ambulatory Visit: Payer: Self-pay | Admitting: Family Medicine

## 2022-09-06 ENCOUNTER — Ambulatory Visit
Admission: RE | Admit: 2022-09-06 | Discharge: 2022-09-06 | Disposition: A | Discharge status: Home / Self Care | Payer: BC Managed Care – PPO | Source: Ambulatory Visit | Attending: Family Medicine | Admitting: Family Medicine

## 2022-09-06 DIAGNOSIS — M25551 Pain in right hip: Secondary | ICD-10-CM

## 2023-03-31 ENCOUNTER — Emergency Department (HOSPITAL_BASED_OUTPATIENT_CLINIC_OR_DEPARTMENT_OTHER)
Admission: EM | Admit: 2023-03-31 | Discharge: 2023-03-31 | Disposition: A | Discharge status: Home / Self Care | Attending: Emergency Medicine | Admitting: Emergency Medicine

## 2023-03-31 ENCOUNTER — Encounter (HOSPITAL_BASED_OUTPATIENT_CLINIC_OR_DEPARTMENT_OTHER): Payer: Self-pay | Admitting: Emergency Medicine

## 2023-03-31 DIAGNOSIS — N200 Calculus of kidney: Secondary | ICD-10-CM | POA: Insufficient documentation

## 2023-03-31 DIAGNOSIS — R109 Unspecified abdominal pain: Secondary | ICD-10-CM | POA: Diagnosis present

## 2023-03-31 LAB — CBC WITH DIFFERENTIAL/PLATELET
Abs Immature Granulocytes: 0.05 10*3/uL (ref 0.00–0.07)
Basophils Absolute: 0.1 10*3/uL (ref 0.0–0.1)
Basophils Relative: 1 %
Eosinophils Absolute: 0.3 10*3/uL (ref 0.0–0.5)
Eosinophils Relative: 3 %
HCT: 42.7 % (ref 36.0–46.0)
Hemoglobin: 14.1 g/dL (ref 12.0–15.0)
Immature Granulocytes: 1 %
Lymphocytes Relative: 16 %
Lymphs Abs: 1.8 10*3/uL (ref 0.7–4.0)
MCH: 31.2 pg (ref 26.0–34.0)
MCHC: 33 g/dL (ref 30.0–36.0)
MCV: 94.5 fL (ref 80.0–100.0)
Monocytes Absolute: 1.1 10*3/uL — ABNORMAL HIGH (ref 0.1–1.0)
Monocytes Relative: 10 %
Neutro Abs: 7.6 10*3/uL (ref 1.7–7.7)
Neutrophils Relative %: 69 %
Platelets: 223 10*3/uL (ref 150–400)
RBC: 4.52 MIL/uL (ref 3.87–5.11)
RDW: 12.3 % (ref 11.5–15.5)
WBC: 10.9 10*3/uL — ABNORMAL HIGH (ref 4.0–10.5)
nRBC: 0 % (ref 0.0–0.2)

## 2023-03-31 LAB — URINALYSIS, ROUTINE W REFLEX MICROSCOPIC
Bacteria, UA: NONE SEEN
Bilirubin Urine: NEGATIVE
Glucose, UA: NEGATIVE mg/dL
Ketones, ur: NEGATIVE mg/dL
Leukocytes,Ua: NEGATIVE
Nitrite: NEGATIVE
Protein, ur: NEGATIVE mg/dL
Specific Gravity, Urine: <1.005 — ABNORMAL LOW (ref 1.005–1.030)
pH: 6.5 (ref 5.0–8.0)

## 2023-03-31 LAB — COMPREHENSIVE METABOLIC PANEL
ALT: 28 U/L (ref 0–44)
AST: 22 U/L (ref 15–41)
Albumin: 4.6 g/dL (ref 3.5–5.0)
Alkaline Phosphatase: 71 U/L (ref 38–126)
Anion gap: 7 (ref 5–15)
BUN: 20 mg/dL (ref 8–23)
CO2: 28 mmol/L (ref 22–32)
Calcium: 9.6 mg/dL (ref 8.9–10.3)
Chloride: 103 mmol/L (ref 98–111)
Creatinine, Ser: 0.83 mg/dL (ref 0.44–1.00)
GFR, Estimated: >60 mL/min (ref >60)
Glucose, Bld: 94 mg/dL (ref 70–99)
Potassium: 3.9 mmol/L (ref 3.5–5.1)
Sodium: 138 mmol/L (ref 135–145)
Total Bilirubin: 0.4 mg/dL (ref 0.0–1.2)
Total Protein: 7.6 g/dL (ref 6.5–8.1)

## 2023-03-31 LAB — RESP PANEL BY RT-PCR (RSV, FLU A&B, COVID)  RVPGX2
Influenza A by PCR: NEGATIVE
Influenza B by PCR: NEGATIVE
Resp Syncytial Virus by PCR: NEGATIVE
SARS Coronavirus 2 by RT PCR: NEGATIVE

## 2023-03-31 LAB — LIPASE, BLOOD: Lipase: 25 U/L (ref 11–51)

## 2023-03-31 MED ORDER — SODIUM CHLORIDE 0.9 % IV BOLUS
1000.0000 mL | Freq: Once | INTRAVENOUS | Status: AC
  Administered 2023-03-31: 1000 mL via INTRAVENOUS

## 2023-03-31 MED ORDER — KETOROLAC TROMETHAMINE 30 MG/ML IJ SOLN
15.0000 mg | Freq: Once | INTRAMUSCULAR | Status: AC
  Administered 2023-03-31: 15 mg via INTRAVENOUS
  Filled 2023-03-31: qty 1

## 2023-03-31 NOTE — ED Provider Notes (Signed)
 [facility] AT [facility] Provider Note   CSN: [id number] Arrival date & time: 03/31/23  1728     History  Chief Complaint  Patient presents with   Flank Pain    Jessica Haney is a 63 y.o. female.  With a history of kidney stones who presents to the ED for left flank pain.  Left flank pain started started today along with dysuria.  No fevers or chills.  Feels similar to prior kidney stones.   Flank Pain       Home Medications Prior to Admission medications   Medication Sig Start Date End Date Taking? Authorizing Provider  amLODipine (NORVASC) 5 MG tablet Take 5 mg by mouth daily. 11/13/19   [provider]  atorvastatin (LIPITOR) 10 MG tablet Take 10 mg by mouth daily.    [provider]  ibuprofen (ADVIL) 200 MG tablet 1 tablet with food or milk as needed    [provider]  influenza vac split quadrivalent PF (AFLURIA QUADRIVALENT) 0.5 ML injection Afluria Qd 2020-21 (36 mos up)(PF)60 mcg (15 mcg x4)/0.5 mL IM syringe  ADM 0.5ML IM UTD    [provider]  Influenza Vac Typ A&B Surf Ant 0.5 ML SUSY Fluvirin 2017-2018 (PF) 45 mcg(15 mcg x3)/0.5 mL intramuscular syringe  ADM 0.5ML IM UTD    [provider]  levothyroxine (SYNTHROID) 75 MCG tablet Take 75 mcg by mouth every morning. 11/08/19   [provider]  metroNIDAZOLE (METROGEL) 1 % gel 1 application to affected area 05/22/16   [provider]  rOPINIRole (REQUIP) 0.25 MG tablet Take 0.25 mg by mouth at bedtime.    [provider]  venlafaxine XR (EFFEXOR-XR) 75 MG 24 hr capsule Take 75 mg by mouth daily.    [provider]      Allergies    Acyclovir and Cyclobenzaprine    Review of Systems   Review of Systems  Genitourinary:  Positive for flank pain.    Physical Exam Updated Vital Signs BP (!) 163/102   Pulse 83   Temp 98.1 F (36.7 C) (Oral)   Resp 20   SpO2 98%  Physical Exam Vitals and nursing  note reviewed.  HENT:     Head: Normocephalic and atraumatic.  Eyes:     Pupils: Pupils are equal, round, and reactive to light.  Cardiovascular:     Rate and Rhythm: Normal rate and regular rhythm.  Pulmonary:     Effort: Pulmonary effort is normal.     Breath sounds: Normal breath sounds.  Abdominal:     Palpations: Abdomen is soft.     Tenderness: There is no abdominal tenderness. There is no right CVA tenderness or left CVA tenderness.  Skin:    General: Skin is warm and dry.  Neurological:     Mental Status: She is alert.  Psychiatric:        Mood and Affect: Mood normal.     ED Results / Procedures / Treatments   Labs (all labs ordered are listed, but only abnormal results are displayed) Labs Reviewed  URINALYSIS, ROUTINE W REFLEX MICROSCOPIC - Abnormal; Notable for the following components:      Result Value   Color, Urine COLORLESS (*)    Specific Gravity, Urine <1.005 (*)    Hgb urine dipstick MODERATE (*)    All other components within normal limits  CBC WITH DIFFERENTIAL/PLATELET - Abnormal; Notable for the following components:   WBC 10.9 (*)  Monocytes Absolute 1.1 (*)    All other components within normal limits  RESP PANEL BY RT-PCR (RSV, FLU A&B, COVID)  RVPGX2  COMPREHENSIVE METABOLIC PANEL  LIPASE, BLOOD    EKG None  Radiology No results found.  Procedures Procedures    Medications Ordered in ED Medications  sodium chloride 0.9 % bolus 1,000 mL (0 mLs Intravenous Stopped 03/31/23 1942)  ketorolac (TORADOL) 30 MG/ML injection 15 mg (15 mg Intravenous Given 03/31/23 1837)    ED Course/ Medical Decision Making/ A&P                                 Medical Decision Making 63 year old female with history of kidney stones presenting for left ankle pain beginning today.  Some dysuria.  Feels similar to prior kidney stones.  No UTI on urine.  Positive hematuria on UA.  Feels better after IV fluids and Toradol.  Suspect she is appropriate for  discharge with outpatient follow-up.  She declined Flomax as she does not want to increase frequency of urination.  Instructed on symptomatic management and given the number for urology follow-up  Amount and/or Complexity of Data Reviewed Labs: ordered.  Risk Prescription drug management.           Final Clinical Impression(s) / ED Diagnoses Final diagnoses:  Kidney stone    Rx / DC Orders ED Discharge Orders     None         Royanne Foots, DO 03/31/23 2043

## 2023-03-31 NOTE — ED Triage Notes (Signed)
 Left flank pain Frequent urination with mild "stinging"  Started today  Hx kidney stone

## 2023-03-31 NOTE — ED Notes (Signed)
 RN reviewed discharge instructions with pt. Pt verbalized understanding and had no further questions. VSS upon discharge.

## 2023-03-31 NOTE — Discharge Instructions (Signed)
 You were seen in the emergency department for left flank pain Your urine was not infected but did show red blood cells This is most likely caused by kidney stone You felt better after IV fluids and medication called Toradol which is similar to ibuprofen Continue taking ibuprofen as directed for discomfort Keep well-hydrated If your symptoms worsen please follow-up with Dr. 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 [id number]!

## 2023-09-13 ENCOUNTER — Other Ambulatory Visit (HOSPITAL_BASED_OUTPATIENT_CLINIC_OR_DEPARTMENT_OTHER): Payer: Self-pay | Admitting: Family Medicine

## 2023-09-13 DIAGNOSIS — E2839 Other primary ovarian failure: Secondary | ICD-10-CM

## 2023-10-17 ENCOUNTER — Other Ambulatory Visit: Payer: Self-pay | Admitting: Medical Genetics

## 2023-12-17 ENCOUNTER — Encounter: Payer: Self-pay | Admitting: Diagnostic Neuroimaging

## 2023-12-17 ENCOUNTER — Ambulatory Visit: Admitting: Diagnostic Neuroimaging

## 2023-12-17 VITALS — BP 117/79 | HR 83 | Ht 64.0 in | Wt 159.2 lb

## 2023-12-17 DIAGNOSIS — G2581 Restless legs syndrome: Secondary | ICD-10-CM

## 2023-12-17 NOTE — Progress Notes (Signed)
 [facility]  PATIENT: Adina Curling DOB: [birth year]  REFERRING CLINICIAN: Dyane Anthony RAMAN, FNP HISTORY FROM: patient  REASON FOR VISIT: new consult   HISTORICAL  CHIEF COMPLAINT:  Chief Complaint  Patient presents with   RM 7     Patient is here alone for restless leg syndrome - has had this issue for 5 years     HISTORY OF PRESENT ILLNESS:   63 year old female here for evaluation of restless leg syndrome.  Symptoms started about 5 years ago with intermittent aching and urgency to move and wrap legs, mainly bruising of the bilateral knees.  Symptoms initially were present around evening and nighttime.  She has been on ropinirole 0.25 mg in the evening to help for the past 2 years.  Now she is having to take it early in the day around 4 PM due to earlier onset of symptoms.  Has had some lab testing including ferritin level of 52.5, TSH 1.45, A1c 6.0, and unremarkable other labs.   REVIEW OF SYSTEMS: Full 14 system review of systems performed and negative with exception of: as per HPI.  ALLERGIES: Allergies  Allergen Reactions   Acyclovir Nausea And Vomiting   Cyclobenzaprine Nausea And Vomiting    HOME MEDICATIONS: Outpatient Medications Prior to Visit  Medication Sig Dispense Refill   amLODipine (NORVASC) 5 MG tablet Take 5 mg by mouth daily.     atorvastatin (LIPITOR) 10 MG tablet Take 10 mg by mouth daily.     ibuprofen  (ADVIL ) 200 MG tablet 1 tablet with food or milk as needed     influenza vac split quadrivalent PF (AFLURIA QUADRIVALENT) 0.5 ML injection Afluria Qd 2020-21 (36 mos up)(PF)60 mcg (15 mcg x4)/0.5 mL IM syringe  ADM 0.5ML IM UTD     Influenza Vac Typ A&B Surf Ant 0.5 ML SUSY Fluvirin 2017-2018 (PF) 45 mcg(15 mcg x3)/0.5 mL intramuscular syringe  ADM 0.5ML IM UTD     levothyroxine (SYNTHROID) 75 MCG tablet Take 75 mcg by mouth every morning.     metroNIDAZOLE (METROGEL) 1 % gel 1 application to affected area     rOPINIRole (REQUIP)  0.25 MG tablet Take 0.25 mg by mouth at bedtime.     venlafaxine XR (EFFEXOR-XR) 37.5 MG 24 hr capsule Take 37.5 mg by mouth daily.     venlafaxine XR (EFFEXOR-XR) 75 MG 24 hr capsule Take 75 mg by mouth daily. (Patient not taking: Reported on 12/17/2023)     No facility-administered medications prior to visit.    PAST MEDICAL HISTORY: Past Medical History:  Diagnosis Date   Hypertension    Hypothyroidism    Restless leg syndrome    Rosacea     PAST SURGICAL HISTORY: Past Surgical History:  Procedure Laterality Date   BUNIONECTOMY     urological surgery     VEIN SURGERY Right     FAMILY HISTORY: Family History  Problem Relation Age of Onset   Breast cancer Mother 26   Hyperlipidemia Mother    Hypertension Mother    Hypertension Father    Hyperlipidemia Father    Emphysema Father    Breast cancer Sister 40   Seizures Neg Hx    Stroke Neg Hx    Migraines Neg Hx    Sleep apnea Neg Hx     SOCIAL HISTORY: Social History   Socioeconomic History   Marital status: Married    Spouse name: Not on file   Number of children: Not on file   Years of  education: Not on file   Highest education level: Not on file  Occupational History   Not on file  Tobacco Use   Smoking status: Never   Smokeless tobacco: Never  Substance and Sexual Activity   Alcohol use: Not on file   Drug use: Not on file   Sexual activity: Not on file  Other Topics Concern   Not on file  Social History 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         3 cups of caffeine daily    Social Drivers of Corporate Investment Banker Strain: Not on file  Food Insecurity: Not on file  Transportation Needs: Not on file  Physical Activity: Not on file  Stress: Not on file  Social Connections: Not on file  Intimate Partner Violence: Not on file     PHYSICAL EXAM  GENERAL EXAM/CONSTITUTIONAL: Vitals:  Vitals:   12/17/23 0953  BP: 117/79  Pulse: 83  SpO2: 98%  Weight: 159 lb 3.2 oz (72.2 kg)   Height: 5' 4 (1.626 m)   Body mass index is 27.33 kg/m. Wt Readings from Last 3 Encounters:  12/17/23 159 lb 3.2 oz (72.2 kg)  01/27/22 154 lb 9.6 oz (70.1 kg)  01/12/22 155 lb (70.3 kg)   Patient is in no distress; well developed, nourished and groomed; neck is supple  CARDIOVASCULAR: Examination of carotid arteries is normal; no carotid bruits Regular rate and rhythm, no murmurs Examination of peripheral vascular system by observation and palpation is normal  EYES: Ophthalmoscopic exam of optic discs and posterior segments is normal; no papilledema or hemorrhages No results found.  MUSCULOSKELETAL: Gait, strength, tone, movements noted in Neurologic exam below  NEUROLOGIC: MENTAL STATUS:      No data to display         awake, alert, oriented to person, place and time recent and remote memory intact normal attention and concentration language fluent, comprehension intact, naming intact fund of knowledge appropriate  CRANIAL NERVE:  2nd - no papilledema on fundoscopic exam 2nd, 3rd, 4th, 6th - pupils equal and reactive to light, visual fields full to confrontation, extraocular muscles intact, no nystagmus 5th - facial sensation symmetric 7th - facial strength symmetric 8th - hearing intact 9th - palate elevates symmetrically, uvula midline 11th - shoulder shrug symmetric 12th - tongue protrusion midline  MOTOR:  normal bulk and tone, full strength in the BUE, BLE  SENSORY:  normal and symmetric to light touch, temperature, vibration  COORDINATION:  finger-nose-finger, fine finger movements normal  REFLEXES:  deep tendon reflexes TRACE and symmetric  GAIT/STATION:  narrow based gait     DIAGNOSTIC DATA (LABS, IMAGING, TESTING) - I reviewed patient records, labs, notes, testing and imaging myself where available.  Lab Results  Component Value Date   WBC 10.9 (H) 03/31/2023   HGB 14.1 03/31/2023   HCT 42.7 03/31/2023   MCV 94.5 03/31/2023    PLT 223 03/31/2023      Component Value Date/Time   NA 138 03/31/2023 1751   K 3.9 03/31/2023 1751   CL 103 03/31/2023 1751   CO2 28 03/31/2023 1751   GLUCOSE 94 03/31/2023 1751   BUN 20 03/31/2023 1751   CREATININE 0.83 03/31/2023 1751   CALCIUM 9.6 03/31/2023 1751   PROT 7.6 03/31/2023 1751   ALBUMIN 4.6 03/31/2023 1751   AST 22 03/31/2023 1751   ALT 28 03/31/2023 1751   ALKPHOS 71 03/31/2023 1751   BILITOT 0.4 03/31/2023 1751  GFRNONAA >60 03/31/2023 1751   GFRAA >60 01/29/2019 1633   No results found for: CHOL, HDL, LDLCALC, LDLDIRECT, TRIG, CHOLHDL No results found for: YHAJ8R No results found for: VITAMINB12 No results found for: TSH  Ferritin - 52.5  A1c 6.0  Tsh 1.45   ASSESSMENT AND PLAN  63 y.o. year old female here with restless leg symptoms since 2020, with normal neurologic exam.  Lab testing unremarkable except for low normal ferritin level.  Initially had some benefit with ropinirole but now may be developing mild augmentation/tolerance.  Dx:  1. RLS (restless legs syndrome)     PLAN:  RLS (since ~2020; no evidence of neuropathy, myopathy) - continue ropinirole 0.25mg  daily (try to take intermittently to avoid augmentation / tolerance) - in future, consider switching to gabapentin / pregabalin - continue mild gentle exercises, stretching, strength training - consider oral iron supplement 325mg  daily (recommended for ferritin level < 75ng/mL) - continue to wean off venlafaxine (which may help)  No follow-ups on file.    EDUARD FABIENE HANLON, MD 12/17/2023, 10:20 AM Certified in Neurology, Neurophysiology and Neuroimaging  [facility] [address], [address] [place], [place] [address] [phone]

## 2023-12-17 NOTE — Patient Instructions (Addendum)
  RLS (since ~2020; no evidence of neuropathy, myopathy) - continue ropinirole 0.25mg  daily (try to take intermittently to avoid augmentation / tolerance) - in future, consider switching to gabapentin / pregabalin - continue mild gentle exercises, stretching, strength training - consider oral iron supplement 325mg  daily (recommended for ferritin < 75ng/mL) - continue to wean off venlafaxine (which may help)

## 2023-12-27 ENCOUNTER — Other Ambulatory Visit (HOSPITAL_COMMUNITY)
Admission: RE | Admit: 2023-12-27 | Discharge: 2023-12-27 | Disposition: A | Discharge status: Home / Self Care | Payer: Self-pay | Source: Ambulatory Visit | Attending: Medical Genetics | Admitting: Medical Genetics

## 2024-01-09 LAB — GENECONNECT MOLECULAR SCREEN: Genetic Analysis Overall Interpretation: NEGATIVE

## 2024-01-10 ENCOUNTER — Encounter (HOSPITAL_COMMUNITY): Payer: Self-pay | Admitting: Obstetrics and Gynecology

## 2024-01-10 NOTE — Progress Notes (Signed)
 Spoke w/ via phone for pre-op interview---Jessica Haney needs dos----   cbc, bmp EKG per anesthesia      Haney results------ COVID test -----patient states asymptomatic no test needed Arrive at -------0530 NPO after MN NO Solid Food.  Clear liquids from MN until---sip of water with am pill Pre-Surgery Ensure or G2:n/a  Med rec completed Medications to take morning of surgery -----synthroid Diabetic medication -----n/a  GLP1 agonist last dose:n/a GLP1 instructions:  Patient instructed no nail polish to be worn day of surgery Patient instructed to bring photo id and insurance card day of surgery Patient aware to have Driver (ride ) / caregiver    for 24 hours after surgery - Jessica Haney (spouse) Patient Special Instructions ----- Pre-Op special Instructions -----  Patient verbalized understanding of instructions that were given at this phone interview. Patient denies chest pain, sob, fever, cough at the interview.

## 2024-01-16 NOTE — H&P (Signed)
 Jessica Haney is an 63 y.o. female. She has had cystocele and rectocele for several years and has been using a pessary. She is sexually active, husband takes out pessary and replaces it for her. This has become annoying and she prefers to have surgical repair  Pertinent Gynecological History: Last mammogram: normal Date: 08/2023 Last pap: normal Date: 2024 OB History: G3, P21-3 SVD x 3   Menstrual History: No LMP recorded. Patient is postmenopausal.    Past Medical History:  Diagnosis Date   Hypertension    Hypothyroidism    Restless leg syndrome    Rosacea     Past Surgical History:  Procedure Laterality Date   BUNIONECTOMY     urological surgery     VEIN SURGERY Right     Family History  Problem Relation Age of Onset   Breast cancer Mother 34   Hyperlipidemia Mother    Hypertension Mother    Hypertension Father    Hyperlipidemia Father    Emphysema Father    Breast cancer Sister 10   Seizures Neg Hx    Stroke Neg Hx    Migraines Neg Hx    Sleep apnea Neg Hx     Social History:  reports that she has never smoked. She has never used smokeless tobacco. She reports that she does not currently use alcohol. She reports that she does not use drugs.  Allergies:  Allergies  Allergen Reactions   Acyclovir Nausea And Vomiting   Cyclobenzaprine Nausea And Vomiting    No medications prior to admission.    Review of Systems  Respiratory: Negative.    Cardiovascular: Negative.     Height 5' 4 (1.626 m), weight 70.3 kg. Physical Exam Constitutional:      Appearance: Normal appearance.  Cardiovascular:     Rate and Rhythm: Normal rate and regular rhythm.     Heart sounds: Normal heart sounds. No murmur heard. Pulmonary:     Effort: Pulmonary effort is normal. No respiratory distress.     Breath sounds: Normal breath sounds. No wheezing.  Abdominal:     General: There is no distension.     Palpations: Abdomen is soft. There is no mass.     Tenderness: There is  no abdominal tenderness.  Genitourinary:    General: Normal vulva.     Comments: Normal size uterus, no significant descensus No adnexal mass Gr 1 cystocele and rectocele Musculoskeletal:     Cervical back: Normal range of motion and neck supple.  Neurological:     Mental Status: She is alert.     No results found for this or any previous visit (from the past 24 hours).  No results found.  Assessment/Plan: Symptomatic cystocele and rectocele, has grown annoyed with pessary. All medical and surgical options have been discussed, she wants to proceed with surgical repair. Surgical procedure, risks, alternatives, chances of relieving her symptoms have all been discussed, questions answered. Will admit for A&P repair, will not do hysterectomy since uterus appears well supported  Jessica Haney Jessica Haney 01/16/2024, 7:02 PM

## 2024-01-17 ENCOUNTER — Ambulatory Visit (HOSPITAL_COMMUNITY)
Admission: RE | Admit: 2024-01-17 | Discharge: 2024-01-17 | Disposition: A | Attending: Obstetrics and Gynecology | Admitting: Obstetrics and Gynecology

## 2024-01-17 ENCOUNTER — Ambulatory Visit (HOSPITAL_COMMUNITY): Admitting: Anesthesiology

## 2024-01-17 ENCOUNTER — Encounter (HOSPITAL_COMMUNITY): Payer: Self-pay | Admitting: Obstetrics and Gynecology

## 2024-01-17 ENCOUNTER — Encounter (HOSPITAL_COMMUNITY): Admission: RE | Disposition: A | Payer: Self-pay | Source: Home / Self Care | Attending: Obstetrics and Gynecology

## 2024-01-17 DIAGNOSIS — N812 Incomplete uterovaginal prolapse: Secondary | ICD-10-CM | POA: Diagnosis present

## 2024-01-17 DIAGNOSIS — I10 Essential (primary) hypertension: Secondary | ICD-10-CM

## 2024-01-17 DIAGNOSIS — N8110 Cystocele, unspecified: Secondary | ICD-10-CM

## 2024-01-17 DIAGNOSIS — N814 Uterovaginal prolapse, unspecified: Secondary | ICD-10-CM | POA: Diagnosis present

## 2024-01-17 DIAGNOSIS — N8111 Cystocele, midline: Secondary | ICD-10-CM | POA: Diagnosis present

## 2024-01-17 HISTORY — PX: ANTERIOR AND POSTERIOR REPAIR: SHX5121

## 2024-01-17 LAB — CBC
HCT: 40.8 % (ref 36.0–46.0)
Hemoglobin: 13.4 g/dL (ref 12.0–15.0)
MCH: 30.7 pg (ref 26.0–34.0)
MCHC: 32.8 g/dL (ref 30.0–36.0)
MCV: 93.4 fL (ref 80.0–100.0)
Platelets: 217 K/uL (ref 150–400)
RBC: 4.37 MIL/uL (ref 3.87–5.11)
RDW: 12.3 % (ref 11.5–15.5)
WBC: 6.7 K/uL (ref 4.0–10.5)
nRBC: 0 % (ref 0.0–0.2)

## 2024-01-17 LAB — BASIC METABOLIC PANEL WITH GFR
Anion gap: 10 (ref 5–15)
BUN: 13 mg/dL (ref 8–23)
CO2: 25 mmol/L (ref 22–32)
Calcium: 9 mg/dL (ref 8.9–10.3)
Chloride: 104 mmol/L (ref 98–111)
Creatinine, Ser: 0.5 mg/dL (ref 0.44–1.00)
GFR, Estimated: >60 mL/min (ref >60)
Glucose, Bld: 103 mg/dL — ABNORMAL HIGH (ref 70–99)
Potassium: 3.8 mmol/L (ref 3.5–5.1)
Sodium: 139 mmol/L (ref 135–145)

## 2024-01-17 SURGERY — ANTERIOR (CYSTOCELE) AND POSTERIOR REPAIR (RECTOCELE)
Anesthesia: General

## 2024-01-17 MED ORDER — IBUPROFEN 600 MG PO TABS: 600.0000 mg | ORAL_TABLET | Freq: Four times a day (QID) | ORAL | Status: DC

## 2024-01-17 MED ORDER — SIMETHICONE 80 MG PO CHEW: 80.0000 mg | CHEWABLE_TABLET | Freq: Four times a day (QID) | ORAL | Status: DC | PRN

## 2024-01-17 MED ORDER — CHLORHEXIDINE GLUCONATE 0.12 % MT SOLN
15.0000 mL | Freq: Once | OROMUCOSAL | Status: AC
  Administered 2024-01-17: 15 mL via OROMUCOSAL

## 2024-01-17 MED ORDER — PROPOFOL 10 MG/ML IV BOLUS
INTRAVENOUS | Status: AC
  Filled 2024-01-17: qty 20

## 2024-01-17 MED ORDER — SCOPOLAMINE 1 MG/3DAYS TD PT72
1.0000 | MEDICATED_PATCH | TRANSDERMAL | Status: DC
  Administered 2024-01-17: 1 mg via TRANSDERMAL

## 2024-01-17 MED ORDER — HYDROMORPHONE HCL 1 MG/ML IJ SOLN: 1.0000 mg | INTRAMUSCULAR | Status: DC | PRN

## 2024-01-17 MED ORDER — MIDAZOLAM HCL (PF) 2 MG/2ML IJ SOLN
INTRAMUSCULAR | Status: DC | PRN
  Administered 2024-01-17: 2 mg via INTRAVENOUS

## 2024-01-17 MED ORDER — KETOROLAC TROMETHAMINE 30 MG/ML IJ SOLN
30.0000 mg | Freq: Four times a day (QID) | INTRAMUSCULAR | Status: DC
  Administered 2024-01-17: 30 mg via INTRAVENOUS
  Filled 2024-01-17: qty 1

## 2024-01-17 MED ORDER — PHENYLEPHRINE 80 MCG/ML (10ML) SYRINGE FOR IV PUSH (FOR BLOOD PRESSURE SUPPORT)
PREFILLED_SYRINGE | INTRAVENOUS | Status: AC
  Filled 2024-01-17: qty 10

## 2024-01-17 MED ORDER — ALUM & MAG HYDROXIDE-SIMETH 200-200-20 MG/5ML PO SUSP: 30.0000 mL | ORAL | Status: DC | PRN

## 2024-01-17 MED ORDER — DEXAMETHASONE SOD PHOSPHATE PF 10 MG/ML IJ SOLN
INTRAMUSCULAR | Status: DC | PRN
  Administered 2024-01-17: 10 mg via INTRAVENOUS

## 2024-01-17 MED ORDER — PROPOFOL 10 MG/ML IV BOLUS
INTRAVENOUS | Status: DC | PRN
  Administered 2024-01-17: 200 mg via INTRAVENOUS

## 2024-01-17 MED ORDER — CHLORHEXIDINE GLUCONATE 0.12 % MT SOLN
OROMUCOSAL | Status: AC
  Filled 2024-01-17: qty 15

## 2024-01-17 MED ORDER — MIDAZOLAM HCL (PF) 2 MG/2ML IJ SOLN: 0.5000 mg | Freq: Once | INTRAMUSCULAR | Status: DC | PRN

## 2024-01-17 MED ORDER — GABAPENTIN 300 MG PO CAPS
ORAL_CAPSULE | ORAL | Status: AC
  Filled 2024-01-17: qty 1

## 2024-01-17 MED ORDER — LIDOCAINE 2% (20 MG/ML) 5 ML SYRINGE
INTRAMUSCULAR | Status: AC
  Filled 2024-01-17: qty 5

## 2024-01-17 MED ORDER — 0.9 % SODIUM CHLORIDE (POUR BTL) OPTIME
TOPICAL | Status: DC | PRN
  Administered 2024-01-17: 1000 mL

## 2024-01-17 MED ORDER — LACTATED RINGERS IV SOLN: INTRAVENOUS | Status: DC

## 2024-01-17 MED ORDER — SUGAMMADEX SODIUM 200 MG/2ML IV SOLN
INTRAVENOUS | Status: AC
  Filled 2024-01-17: qty 2

## 2024-01-17 MED ORDER — ONDANSETRON HCL 4 MG PO TABS: 4.0000 mg | ORAL_TABLET | Freq: Four times a day (QID) | ORAL | Status: DC | PRN

## 2024-01-17 MED ORDER — PHENYLEPHRINE 80 MCG/ML (10ML) SYRINGE FOR IV PUSH (FOR BLOOD PRESSURE SUPPORT)
PREFILLED_SYRINGE | INTRAVENOUS | Status: DC | PRN
  Administered 2024-01-17 (×2): 120 ug via INTRAVENOUS

## 2024-01-17 MED ORDER — SCOPOLAMINE 1 MG/3DAYS TD PT72
MEDICATED_PATCH | TRANSDERMAL | Status: AC
  Filled 2024-01-17: qty 1

## 2024-01-17 MED ORDER — HYDROMORPHONE HCL 1 MG/ML IJ SOLN
INTRAMUSCULAR | Status: AC
  Filled 2024-01-17: qty 1

## 2024-01-17 MED ORDER — ROCURONIUM BROMIDE 10 MG/ML (PF) SYRINGE
PREFILLED_SYRINGE | INTRAVENOUS | Status: DC | PRN
  Administered 2024-01-17: 10 mg via INTRAVENOUS
  Administered 2024-01-17: 60 mg via INTRAVENOUS

## 2024-01-17 MED ORDER — ACETAMINOPHEN 500 MG PO TABS
1000.0000 mg | ORAL_TABLET | ORAL | Status: AC
  Administered 2024-01-17: 1000 mg via ORAL

## 2024-01-17 MED ORDER — GABAPENTIN 300 MG PO CAPS
300.0000 mg | ORAL_CAPSULE | Freq: Three times a day (TID) | ORAL | Status: DC
  Administered 2024-01-17: 300 mg via ORAL
  Filled 2024-01-17: qty 1

## 2024-01-17 MED ORDER — FENTANYL CITRATE (PF) 100 MCG/2ML IJ SOLN
INTRAMUSCULAR | Status: AC
  Filled 2024-01-17: qty 2

## 2024-01-17 MED ORDER — ONDANSETRON HCL 4 MG/2ML IJ SOLN
INTRAMUSCULAR | Status: AC
  Filled 2024-01-17: qty 2

## 2024-01-17 MED ORDER — ROPINIROLE HCL 0.25 MG PO TABS
0.2500 mg | ORAL_TABLET | Freq: Every day | ORAL | Status: DC
  Filled 2024-01-17: qty 1

## 2024-01-17 MED ORDER — EPHEDRINE SULFATE-NACL 50-0.9 MG/10ML-% IV SOSY
PREFILLED_SYRINGE | INTRAVENOUS | Status: DC | PRN
  Administered 2024-01-17 (×4): 5 mg via INTRAVENOUS

## 2024-01-17 MED ORDER — OXYCODONE HCL 5 MG/5ML PO SOLN: 5.0000 mg | Freq: Once | ORAL | Status: DC | PRN

## 2024-01-17 MED ORDER — ONDANSETRON HCL 4 MG/2ML IJ SOLN
INTRAMUSCULAR | Status: DC | PRN
  Administered 2024-01-17: 4 mg via INTRAVENOUS

## 2024-01-17 MED ORDER — ONDANSETRON HCL 4 MG/2ML IJ SOLN: 4.0000 mg | Freq: Four times a day (QID) | INTRAMUSCULAR | Status: DC | PRN

## 2024-01-17 MED ORDER — OXYCODONE HCL 5 MG PO TABS: 5.0000 mg | ORAL_TABLET | Freq: Once | ORAL | Status: DC | PRN

## 2024-01-17 MED ORDER — GABAPENTIN 300 MG PO CAPS
300.0000 mg | ORAL_CAPSULE | ORAL | Status: AC
  Administered 2024-01-17: 300 mg via ORAL

## 2024-01-17 MED ORDER — MIDAZOLAM HCL 2 MG/2ML IJ SOLN
INTRAMUSCULAR | Status: AC
  Filled 2024-01-17: qty 2

## 2024-01-17 MED ORDER — LIDOCAINE 2% (20 MG/ML) 5 ML SYRINGE
INTRAMUSCULAR | Status: DC | PRN
  Administered 2024-01-17: 20 mg via INTRAVENOUS

## 2024-01-17 MED ORDER — MEPERIDINE HCL 25 MG/ML IJ SOLN: 6.2500 mg | INTRAMUSCULAR | Status: DC | PRN

## 2024-01-17 MED ORDER — ORAL CARE MOUTH RINSE: 15.0000 mL | Freq: Once | OROMUCOSAL | Status: AC

## 2024-01-17 MED ORDER — ACETAMINOPHEN 500 MG PO TABS
ORAL_TABLET | ORAL | Status: AC
  Filled 2024-01-17: qty 2

## 2024-01-17 MED ORDER — EPHEDRINE 5 MG/ML INJ
INTRAVENOUS | Status: AC
  Filled 2024-01-17: qty 5

## 2024-01-17 MED ORDER — VENLAFAXINE HCL ER 37.5 MG PO CP24
37.5000 mg | ORAL_CAPSULE | Freq: Every day | ORAL | Status: DC
  Filled 2024-01-17: qty 1

## 2024-01-17 MED ORDER — FENTANYL CITRATE (PF) 250 MCG/5ML IJ SOLN
INTRAMUSCULAR | Status: DC | PRN
  Administered 2024-01-17: 100 ug via INTRAVENOUS

## 2024-01-17 MED ORDER — BISACODYL 10 MG RE SUPP: 10.0000 mg | Freq: Every day | RECTAL | Status: DC | PRN

## 2024-01-17 MED ORDER — ATORVASTATIN CALCIUM 10 MG PO TABS
10.0000 mg | ORAL_TABLET | Freq: Every day | ORAL | Status: DC
  Filled 2024-01-17: qty 1

## 2024-01-17 MED ORDER — ESTRADIOL 0.01 % VA CREA
TOPICAL_CREAM | VAGINAL | Status: AC
  Filled 2024-01-17: qty 42.5

## 2024-01-17 MED ORDER — OXYCODONE HCL 5 MG PO TABS: 5.0000 mg | ORAL_TABLET | ORAL | 0 refills | Status: AC | PRN

## 2024-01-17 MED ORDER — CEFAZOLIN SODIUM-DEXTROSE 2-4 GM/100ML-% IV SOLN
INTRAVENOUS | Status: AC
  Filled 2024-01-17: qty 100

## 2024-01-17 MED ORDER — OXYCODONE HCL 5 MG PO TABS: 5.0000 mg | ORAL_TABLET | ORAL | Status: DC | PRN

## 2024-01-17 MED ORDER — HYDROMORPHONE HCL 1 MG/ML IJ SOLN
0.2500 mg | INTRAMUSCULAR | Status: DC | PRN
  Administered 2024-01-17 (×2): 0.5 mg via INTRAVENOUS

## 2024-01-17 MED ORDER — DEXTROSE-SODIUM CHLORIDE 5-0.45 % IV SOLN: INTRAVENOUS | Status: AC

## 2024-01-17 MED ORDER — SUGAMMADEX SODIUM 200 MG/2ML IV SOLN
INTRAVENOUS | Status: DC | PRN
  Administered 2024-01-17: 200 mg via INTRAVENOUS

## 2024-01-17 MED ORDER — LEVOTHYROXINE SODIUM 25 MCG PO TABS: 75.0000 ug | ORAL_TABLET | Freq: Every day | ORAL | Status: DC

## 2024-01-17 MED ORDER — MENTHOL 3 MG MT LOZG: 1.0000 | LOZENGE | OROMUCOSAL | Status: DC | PRN

## 2024-01-17 MED ORDER — AMLODIPINE BESYLATE 5 MG PO TABS: 5.0000 mg | ORAL_TABLET | Freq: Every day | ORAL | Status: DC

## 2024-01-17 MED ORDER — CEFAZOLIN SODIUM-DEXTROSE 2-4 GM/100ML-% IV SOLN
2.0000 g | INTRAVENOUS | Status: AC
  Administered 2024-01-17: 2 g via INTRAVENOUS

## 2024-01-17 MED ORDER — ACETAMINOPHEN 500 MG PO TABS
1000.0000 mg | ORAL_TABLET | Freq: Four times a day (QID) | ORAL | Status: DC
  Administered 2024-01-17: 1000 mg via ORAL
  Filled 2024-01-17: qty 2

## 2024-01-17 SURGICAL SUPPLY — 28 items
BLADE SURG 15 STRL LF DISP TIS (BLADE) IMPLANT
CATH ROBINSON RED A/P 16FR (CATHETERS) ×1 IMPLANT
GAUZE PACKING 1/2INX5YD STRL (GAUZE/BANDAGES/DRESSINGS) IMPLANT
GAUZE SPONGE 4X4 16PLY XRAY LF (GAUZE/BANDAGES/DRESSINGS) IMPLANT
GAUZE STRIP PACKING 2INX5YD (MISCELLANEOUS) ×1 IMPLANT
GLOVE BIO SURGEON STRL SZ 6.5 (GLOVE) ×1 IMPLANT
GLOVE BIO SURGEON STRL SZ8 (GLOVE) ×1 IMPLANT
GLOVE BIOGEL PI IND STRL 7.0 (GLOVE) ×1 IMPLANT
GLOVE PI ORTHO PRO STRL 7.5 (GLOVE) ×1 IMPLANT
GOWN STRL REUS W/ TWL LRG LVL3 (GOWN DISPOSABLE) ×1 IMPLANT
GOWN STRL REUS W/ TWL XL LVL3 (GOWN DISPOSABLE) ×1 IMPLANT
HOLDER FOLEY CATH W/STRAP (MISCELLANEOUS) ×1 IMPLANT
KIT TURNOVER KIT B (KITS) ×1 IMPLANT
NDL MAYO CATGUT SZ4 TPR NDL (NEEDLE) IMPLANT
PACK VAGINAL WOMENS (CUSTOM PROCEDURE TRAY) ×1 IMPLANT
PAD OB MATERNITY 11 LF (PERSONAL CARE ITEMS) ×1 IMPLANT
SOLN 0.9% NACL POUR BTL 1000ML (IV SOLUTION) ×1 IMPLANT
SPIKE FLUID TRANSFER (MISCELLANEOUS) IMPLANT
SURGILUBE 2OZ TUBE FLIPTOP (MISCELLANEOUS) IMPLANT
SUT PDS AB 2-0 CT2 27 (SUTURE) IMPLANT
SUT PROLENE 1 CTX 30 8455H (SUTURE) IMPLANT
SUT SILK 0 SH 30 (SUTURE) IMPLANT
SUT VIC AB 2-0 CT1 (SUTURE) IMPLANT
SUT VIC AB 2-0 CT2 27 (SUTURE) ×2 IMPLANT
SUT VIC AB 3-0 SH 27X BRD (SUTURE) IMPLANT
SUT VICRYL 0 UR6 27IN ABS (SUTURE) IMPLANT
TOWEL GREEN STERILE FF (TOWEL DISPOSABLE) ×2 IMPLANT
TRAY FOLEY W/BAG SLVR 14FR (SET/KITS/TRAYS/PACK) ×1 IMPLANT

## 2024-01-17 NOTE — Progress Notes (Signed)
°   01/17/24 1742  Departure Condition  Departure Condition Good  Mobility at [facility]  Patient/Caregiver Teaching Teach Back Method Used;Discharge instructions reviewed;Prescriptions reviewed;Pain management discussed;Follow-up care reviewed;Medications discussed;Patient/caregiver verbalized understanding  Departure Mode With significant other   Patient alert and oriented x4, VS and pain stable for discharge. Pt reports received text about medications at pharmacy and will wait to take further medication.

## 2024-01-17 NOTE — Anesthesia Postprocedure Evaluation (Signed)
 Anesthesia Post Note  Patient: Jessica Haney  Procedure(s) Performed: ANTERIOR (CYSTOCELE) AND POSTERIOR REPAIR (RECTOCELE)     Patient location during evaluation: PACU Anesthesia Type: General Level of consciousness: awake and alert, oriented and patient cooperative Pain management: pain level controlled Vital Signs Assessment: post-procedure vital signs reviewed and stable Respiratory status: spontaneous breathing, nonlabored ventilation and respiratory function stable Cardiovascular status: blood pressure returned to baseline and stable Postop Assessment: no apparent nausea or vomiting Anesthetic complications: no   No notable events documented.  Last Vitals:  Vitals:   01/17/24 1143 01/17/24 1211  BP:    Pulse:    Resp:    Temp:    SpO2: 94% 95%    Last Pain:  Vitals:   01/17/24 1211  TempSrc:   PainSc: Asleep                 [name],E. Olean Sangster

## 2024-01-17 NOTE — Anesthesia Procedure Notes (Signed)
 Procedure Name: Intubation Date/Time: 01/17/2024 7:40 AM  Performed by: Ramello Cordial A, CRNAPre-anesthesia Checklist: Patient identified, Emergency Drugs available, Suction available and Patient being monitored Patient Re-evaluated:Patient Re-evaluated prior to induction Oxygen Delivery Method: Circle System Utilized Preoxygenation: Pre-oxygenation with 100% oxygen Induction Type: IV induction Ventilation: Mask ventilation without difficulty Laryngoscope Size: 3 and Mac Grade View: Grade III Tube type: Oral Tube size: 7.5 mm Number of attempts: 1 Airway Equipment and Method: Stylet and Oral airway Placement Confirmation: ETT inserted through vocal cords under direct vision, positive ETCO2 and breath sounds checked- equal and bilateral Secured at: 22 cm Tube secured with: Tape Dental Injury: Teeth and Oropharynx as per pre-operative assessment

## 2024-01-17 NOTE — Anesthesia Preprocedure Evaluation (Addendum)
 Anesthesia Evaluation  Patient identified by MRN, date of birth, ID band Patient awake    Reviewed: Allergy & Precautions, NPO status , Patient's Chart, lab work & pertinent test results  History of Anesthesia Complications Negative for: history of anesthetic complications  Airway Mallampati: II  TM Distance: >3 FB Neck ROM: Full    Dental  (+) Dental Advisory Given, Teeth Intact   Pulmonary neg pulmonary ROS   breath sounds clear to auscultation       Cardiovascular hypertension, Pt. on medications (-) CAD  Rhythm:Regular Rate:Normal     Neuro/Psych negative neurological ROS     GI/Hepatic negative GI ROS, Neg liver ROS,,,  Endo/Other  Hypothyroidism    Renal/GU negative Renal ROS     Musculoskeletal   Abdominal   Peds  Hematology Hb 13.4, plt 217k   Anesthesia Other Findings   Reproductive/Obstetrics                              Anesthesia Physical Anesthesia Plan  ASA: 2  Anesthesia Plan: General   Post-op Pain Management: Tylenol PO (pre-op)*   Induction: Intravenous  PONV Risk Score and Plan: 3 and Ondansetron, Dexamethasone and Scopolamine patch - Pre-op  Airway Management Planned: Oral ETT  Additional Equipment: None  Intra-op Plan:   Post-operative Plan: Extubation in OR  Informed Consent: I have reviewed the patients History and Physical, chart, labs and discussed the procedure including the risks, benefits and alternatives for the proposed anesthesia with the patient or authorized representative who has indicated his/her understanding and acceptance.     Dental advisory given  Plan Discussed with: CRNA and Surgeon  Anesthesia Plan Comments:          Anesthesia Quick Evaluation

## 2024-01-17 NOTE — Interval H&P Note (Signed)
 History and Physical Interval Note:  01/17/2024 7:15 AM  Jessica Haney  has presented today for surgery, with the diagnosis of cystocele.  The various methods of treatment have been discussed with the patient and family. After consideration of risks, benefits and other options for treatment, the patient has consented to  Procedures: ANTERIOR (CYSTOCELE) AND POSTERIOR REPAIR (RECTOCELE) (N/A) as a surgical intervention.  The patient's history has been reviewed, patient examined, no change in status, stable for surgery.  I have reviewed the patient's chart and labs.  Questions were answered to the patient's satisfaction.     Jessica Haney Jessica Haney

## 2024-01-17 NOTE — Plan of Care (Signed)

## 2024-01-17 NOTE — Discharge Instructions (Addendum)
 Routine instructions for A&P repair Call for heavy bleeding, expect some spotting OTC [place] daily to keep stool soft and prevent constipation

## 2024-01-17 NOTE — Transfer of Care (Signed)
 Immediate Anesthesia Transfer of Care Note  Patient: Jessica Haney  Procedure(s) Performed: ANTERIOR (CYSTOCELE) AND POSTERIOR REPAIR (RECTOCELE)  Patient Location: PACU  Anesthesia Type:General  Level of Consciousness: awake, alert , and oriented  Airway & Oxygen Therapy: Patient Spontanous Breathing  Post-op Assessment: Report given to RN and Post -op Vital signs reviewed and stable  Post vital signs: Reviewed and stable  Last Vitals:  Vitals Value Taken Time  BP 137/84 01/17/24 09:11  Temp    Pulse 84 01/17/24 09:13  Resp 14 01/17/24 09:13  SpO2 93 % 01/17/24 09:13  Vitals shown include unfiled device data.  Last Pain:  Vitals:   01/17/24 0600  TempSrc: Oral  PainSc: 0-No pain      Patients Stated Pain Goal: 6 (01/17/24 0600)  Complications: No notable events documented.

## 2024-01-17 NOTE — Op Note (Addendum)
 Preoperative diagnosis: Symptomatic rectocele and cystocele Postoperative diagnosis: Same Procedure: Anterior and posterior repair Surgeon: Krystal Deaner M.D. Asst.: Elane Sous, NP Anesthesia: General Findings: She had a Gr 1 cystocele and a grade 1-2 rectocele. Assistance from NP Sous was required for retraction and exposure estimated blood loss: 400 cc Complications: None Specimens: None  Procedure in detail:  The patient was taken to the operating room and placed in the  dorsosupine position. General anesthesia was induced and her legs were placed in mobile stirrups. Perineum and vagina were then prepped and draped in usual sterile fashion and bladder drained with a foley catheter. A weighted speculum was placed in the vagina. The anterior vaginal was grasped with 2 Allis clamps at the cervicovaginal junction and a small horizontal piece of vaginal tissue was removed. The cystocele was then mobilized by dissecting anteriorly in the midline from the vaginal cuff to within about 2 cm of the urethral meatus in the midline. Cystocele was mobilized laterally. She had fairly significant bleeding requiring several figure 8 sutures of 3-0 Vicryl. Cystocele was then reduced with 3 interrupted sutures of 2-0 Vicryl with adequate reduction. A small amount of excess vaginal tissue was removed and the incision was closed with running locking 2-0 Vicryl with adequate closure and adequate hemostasis.  Attention was now turned to the rectocele repair. The hymenal ring was grasped with 2 Allis clamps at a distance that when brought together would still allow two fingers to  easily pass into the vagina. A horizontal piece of tissue was removed. The posterior vaginal mucosa was then dissected in the midline from the hymenal ring to just distal to the cervicovaginal junction. Rectocele was then mobilized bilaterally sharply and bluntly.  The rectocele was then reduced with interrupted sutures of 2-0. This achieved  good reduction. A rectal exam confirmed good reduction of the rectocele. Excess vaginal mucosa was then removed sharply. Vagina was closed from the vaginal apex to the hymenal ring with running locking 2-0 Vicryl. This achieved adequate closure and adequate hemostasis. The vagina was then packed with 2 inch gauze coated with lubricant. During packing some bleeding was encountered. The packing was removed, bleeding from the posterior suture line controlled with several sutures of 2-0 Vicryl, and the vagina was then packed again. The patient tolerated the procedure well. She was awakened and taken to the recovery in stable condition. Counts were correct, she had PAS hose on throughout the procedure. She received Ancef 2 g for surgical prophylaxis.

## 2024-01-18 ENCOUNTER — Encounter (HOSPITAL_COMMUNITY): Payer: Self-pay | Admitting: Obstetrics and Gynecology

## 2024-05-07 ENCOUNTER — Other Ambulatory Visit (HOSPITAL_BASED_OUTPATIENT_CLINIC_OR_DEPARTMENT_OTHER)

## 2024-05-14 ENCOUNTER — Other Ambulatory Visit (HOSPITAL_BASED_OUTPATIENT_CLINIC_OR_DEPARTMENT_OTHER)
